# Patient Record
Sex: Male | Born: 1970
Health system: Southern US, Community
[De-identification: ages and names within clinical notes are randomized; demographics above are authoritative.]

## PROBLEM LIST (undated history)

## (undated) DIAGNOSIS — D689 Coagulation defect, unspecified: Secondary | ICD-10-CM

## (undated) DIAGNOSIS — K429 Umbilical hernia without obstruction or gangrene: Secondary | ICD-10-CM

## (undated) DIAGNOSIS — M545 Low back pain, unspecified: Secondary | ICD-10-CM

## (undated) DIAGNOSIS — I513 Intracardiac thrombosis, not elsewhere classified: Secondary | ICD-10-CM

## (undated) HISTORY — PX: ABDOMINAL SURGERY: SHX537

---

## 2001-11-15 ENCOUNTER — Emergency Department (HOSPITAL_COMMUNITY): Admission: EM | Admit: 2001-11-15 | Discharge: 2001-11-16 | Payer: Self-pay | Admitting: Emergency Medicine

## 2002-01-23 ENCOUNTER — Emergency Department (HOSPITAL_COMMUNITY): Admission: EM | Admit: 2002-01-23 | Discharge: 2002-01-24 | Payer: Self-pay | Admitting: Emergency Medicine

## 2006-03-28 HISTORY — PX: ANKLE FRACTURE SURGERY: SHX122

## 2006-03-28 HISTORY — PX: ORIF FEMUR FRACTURE: SHX2119

## 2007-02-23 ENCOUNTER — Inpatient Hospital Stay (HOSPITAL_COMMUNITY): Admission: EM | Admit: 2007-02-23 | Discharge: 2007-03-10 | Payer: Self-pay | Admitting: Emergency Medicine

## 2007-03-01 ENCOUNTER — Ambulatory Visit: Payer: Self-pay | Admitting: Physical Medicine & Rehabilitation

## 2007-08-10 ENCOUNTER — Encounter: Admission: RE | Admit: 2007-08-10 | Discharge: 2007-08-10 | Payer: Self-pay | Admitting: Orthopedic Surgery

## 2007-11-09 ENCOUNTER — Encounter
Admission: RE | Admit: 2007-11-09 | Discharge: 2007-11-09 | Payer: Self-pay | Admitting: Physical Medicine & Rehabilitation
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
## 2011-01-03 LAB — POCT I-STAT 3, ART BLOOD GAS (G3+)
Bicarbonate: 29.3 — ABNORMAL HIGH
Operator id: 280981
TCO2: 31
pCO2 arterial: 48.1 — ABNORMAL HIGH
pH, Arterial: 7.395
pO2, Arterial: 91

## 2011-01-03 LAB — PREPARE FRESH FROZEN PLASMA

## 2011-01-03 LAB — PROTIME-INR
INR: 1.5
INR: 3.3 — ABNORMAL HIGH
Prothrombin Time: 18.5 — ABNORMAL HIGH
Prothrombin Time: 32.6 — ABNORMAL HIGH
Prothrombin Time: 34.6 — ABNORMAL HIGH

## 2011-01-03 LAB — BASIC METABOLIC PANEL
BUN: 7
BUN: 7
CO2: 24
CO2: 29
Calcium: 7.8 — ABNORMAL LOW
Chloride: 102
Chloride: 104
Creatinine, Ser: 0.75
GFR calc non Af Amer: >60
Glucose, Bld: 93
Sodium: 136

## 2011-01-03 LAB — CBC
HCT: 30.8 — ABNORMAL LOW
Hemoglobin: 10.3 — ABNORMAL LOW
Hemoglobin: 11.2 — ABNORMAL LOW
MCHC: 34.1
MCV: 89.1
MCV: 89.5
RDW: 13.6
RDW: 14
RDW: 14.6
WBC: 8.5
WBC: 9.7

## 2011-01-03 LAB — TYPE AND SCREEN
ABO/RH(D): B POS
Antibody Screen: NEGATIVE

## 2011-01-04 LAB — CULTURE, BAL-QUANTITATIVE W GRAM STAIN: Colony Count: 50000

## 2011-01-04 LAB — POCT I-STAT 3, ART BLOOD GAS (G3+)
Bicarbonate: 23.5
O2 Saturation: 99
TCO2: 25
pCO2 arterial: 36.1
pH, Arterial: 7.422
pO2, Arterial: 148 — ABNORMAL HIGH

## 2011-01-04 LAB — POCT I-STAT 7, (LYTES, BLD GAS, ICA,H+H)
Calcium, Ion: 0.89 — ABNORMAL LOW
Calcium, Ion: 0.99 — ABNORMAL LOW
HCT: 40
HCT: 45
Hemoglobin: 15.3
Operator id: 151301
Operator id: 151301
Patient temperature: 35
Patient temperature: 35.4
pCO2 arterial: 32.7 — ABNORMAL LOW
pCO2 arterial: 48.8 — ABNORMAL HIGH
pO2, Arterial: 100
pO2, Arterial: 184 — ABNORMAL HIGH

## 2011-01-04 LAB — CBC
HCT: 35.4 — ABNORMAL LOW
HCT: 35.5 — ABNORMAL LOW
Hemoglobin: 11.9 — ABNORMAL LOW
Hemoglobin: 12.1 — ABNORMAL LOW
Hemoglobin: 12.1 — ABNORMAL LOW
Hemoglobin: 12.3 — ABNORMAL LOW
Hemoglobin: 17.5 — ABNORMAL HIGH
MCHC: 33.3
MCHC: 33.8
MCHC: 34.2
MCV: 88.4
MCV: 90.1
Platelets: 183
RBC: 3.95 — ABNORMAL LOW
RBC: 4.02 — ABNORMAL LOW
RBC: 4.04 — ABNORMAL LOW
RBC: 5.83 — ABNORMAL HIGH
RDW: 13.8
RDW: 14
RDW: 14.3
WBC: 12.2 — ABNORMAL HIGH

## 2011-01-04 LAB — POCT I-STAT 4, (NA,K, GLUC, HGB,HCT)
Glucose, Bld: 95
Hemoglobin: 13.6
Hemoglobin: 17

## 2011-01-04 LAB — I-STAT EC8
Acid-base deficit: 5 — ABNORMAL HIGH
BUN: 5 — ABNORMAL LOW
Bicarbonate: 19.9 — ABNORMAL LOW
Bicarbonate: 20.2
Chloride: 107
Glucose, Bld: 83
HCT: 40
Hemoglobin: 12.2 — ABNORMAL LOW
Hemoglobin: 13.6
Operator id: 198871
Sodium: 141
Sodium: 142
TCO2: 21
pCO2 arterial: 36.2
pH, Arterial: 7.349 — ABNORMAL LOW

## 2011-01-04 LAB — BASIC METABOLIC PANEL
BUN: 6
CO2: 27
CO2: 28
Calcium: 6.9 — ABNORMAL LOW
Calcium: 7.9 — ABNORMAL LOW
GFR calc Af Amer: >60
GFR calc non Af Amer: >60
GFR calc non Af Amer: >60
Glucose, Bld: 113 — ABNORMAL HIGH
Glucose, Bld: 98
Potassium: 4.5
Sodium: 133 — ABNORMAL LOW

## 2011-01-04 LAB — I-STAT 8, (EC8 V) (CONVERTED LAB)
Acid-base deficit: 5 — ABNORMAL HIGH
BUN: 6
Chloride: 106
pCO2, Ven: 39.5 — ABNORMAL LOW
pH, Ven: 7.332 — ABNORMAL HIGH

## 2011-01-04 LAB — PROTIME-INR
INR: 1.1
Prothrombin Time: 13.3

## 2011-01-04 LAB — POCT I-STAT CREATININE: Operator id: 282201

## 2011-01-04 LAB — ETHANOL: Alcohol, Ethyl (B): 378 — ABNORMAL HIGH

## 2019-07-22 DIAGNOSIS — R112 Nausea with vomiting, unspecified: Secondary | ICD-10-CM | POA: Diagnosis not present

## 2019-07-22 DIAGNOSIS — K429 Umbilical hernia without obstruction or gangrene: Secondary | ICD-10-CM | POA: Diagnosis not present

## 2019-07-22 DIAGNOSIS — R1032 Left lower quadrant pain: Secondary | ICD-10-CM | POA: Diagnosis not present

## 2019-07-22 DIAGNOSIS — M549 Dorsalgia, unspecified: Secondary | ICD-10-CM | POA: Diagnosis not present

## 2019-08-21 DIAGNOSIS — K429 Umbilical hernia without obstruction or gangrene: Secondary | ICD-10-CM | POA: Diagnosis not present

## 2019-08-21 DIAGNOSIS — Z1211 Encounter for screening for malignant neoplasm of colon: Secondary | ICD-10-CM | POA: Diagnosis not present

## 2019-09-06 DIAGNOSIS — Z03818 Encounter for observation for suspected exposure to other biological agents ruled out: Secondary | ICD-10-CM | POA: Diagnosis not present

## 2019-09-09 DIAGNOSIS — K432 Incisional hernia without obstruction or gangrene: Secondary | ICD-10-CM | POA: Diagnosis not present

## 2019-09-11 DIAGNOSIS — K432 Incisional hernia without obstruction or gangrene: Secondary | ICD-10-CM | POA: Diagnosis not present

## 2019-09-13 DIAGNOSIS — Z1211 Encounter for screening for malignant neoplasm of colon: Secondary | ICD-10-CM | POA: Diagnosis not present

## 2019-09-13 DIAGNOSIS — D128 Benign neoplasm of rectum: Secondary | ICD-10-CM | POA: Diagnosis not present

## 2019-09-13 DIAGNOSIS — D122 Benign neoplasm of ascending colon: Secondary | ICD-10-CM | POA: Diagnosis not present

## 2019-09-13 DIAGNOSIS — D123 Benign neoplasm of transverse colon: Secondary | ICD-10-CM | POA: Diagnosis not present

## 2019-09-13 DIAGNOSIS — K648 Other hemorrhoids: Secondary | ICD-10-CM | POA: Diagnosis not present

## 2019-09-13 DIAGNOSIS — K621 Rectal polyp: Secondary | ICD-10-CM | POA: Diagnosis not present

## 2019-09-13 DIAGNOSIS — K635 Polyp of colon: Secondary | ICD-10-CM | POA: Diagnosis not present

## 2019-09-19 DIAGNOSIS — K432 Incisional hernia without obstruction or gangrene: Secondary | ICD-10-CM | POA: Diagnosis not present

## 2019-09-19 DIAGNOSIS — K869 Disease of pancreas, unspecified: Secondary | ICD-10-CM | POA: Diagnosis not present

## 2019-10-08 DIAGNOSIS — I82890 Acute embolism and thrombosis of other specified veins: Secondary | ICD-10-CM | POA: Diagnosis not present

## 2019-10-08 DIAGNOSIS — K869 Disease of pancreas, unspecified: Secondary | ICD-10-CM | POA: Diagnosis not present

## 2019-10-08 DIAGNOSIS — I748 Embolism and thrombosis of other arteries: Secondary | ICD-10-CM | POA: Diagnosis not present

## 2019-10-09 DIAGNOSIS — I81 Portal vein thrombosis: Secondary | ICD-10-CM | POA: Diagnosis not present

## 2019-10-09 DIAGNOSIS — I748 Embolism and thrombosis of other arteries: Secondary | ICD-10-CM | POA: Diagnosis not present

## 2019-10-09 DIAGNOSIS — I82890 Acute embolism and thrombosis of other specified veins: Secondary | ICD-10-CM | POA: Diagnosis not present

## 2019-10-09 DIAGNOSIS — F17200 Nicotine dependence, unspecified, uncomplicated: Secondary | ICD-10-CM | POA: Diagnosis not present

## 2019-10-09 DIAGNOSIS — D519 Vitamin B12 deficiency anemia, unspecified: Secondary | ICD-10-CM | POA: Diagnosis not present

## 2019-10-23 DIAGNOSIS — I81 Portal vein thrombosis: Secondary | ICD-10-CM | POA: Diagnosis not present

## 2019-10-23 DIAGNOSIS — R079 Chest pain, unspecified: Secondary | ICD-10-CM | POA: Diagnosis not present

## 2019-10-24 DIAGNOSIS — I748 Embolism and thrombosis of other arteries: Secondary | ICD-10-CM | POA: Diagnosis not present

## 2019-10-24 DIAGNOSIS — I81 Portal vein thrombosis: Secondary | ICD-10-CM | POA: Diagnosis not present

## 2019-10-24 DIAGNOSIS — Z79899 Other long term (current) drug therapy: Secondary | ICD-10-CM | POA: Diagnosis not present

## 2019-10-24 DIAGNOSIS — R978 Other abnormal tumor markers: Secondary | ICD-10-CM | POA: Diagnosis not present

## 2019-11-01 DIAGNOSIS — D519 Vitamin B12 deficiency anemia, unspecified: Secondary | ICD-10-CM | POA: Diagnosis not present

## 2019-11-21 DIAGNOSIS — I81 Portal vein thrombosis: Secondary | ICD-10-CM | POA: Diagnosis not present

## 2019-11-21 DIAGNOSIS — Z87891 Personal history of nicotine dependence: Secondary | ICD-10-CM | POA: Diagnosis not present

## 2019-11-21 DIAGNOSIS — F17210 Nicotine dependence, cigarettes, uncomplicated: Secondary | ICD-10-CM | POA: Diagnosis not present

## 2019-11-21 DIAGNOSIS — D519 Vitamin B12 deficiency anemia, unspecified: Secondary | ICD-10-CM | POA: Diagnosis not present

## 2019-12-28 ENCOUNTER — Emergency Department (HOSPITAL_COMMUNITY): Payer: BC Managed Care – PPO | Admitting: Anesthesiology

## 2019-12-28 ENCOUNTER — Encounter (HOSPITAL_COMMUNITY)
Admission: EM | Disposition: A | Discharge status: Home / Self Care | Payer: Self-pay | Source: Home / Self Care | Attending: Neurology

## 2019-12-28 ENCOUNTER — Emergency Department (HOSPITAL_COMMUNITY): Payer: BC Managed Care – PPO

## 2019-12-28 ENCOUNTER — Inpatient Hospital Stay (HOSPITAL_COMMUNITY)
Admission: EM | Admit: 2019-12-28 | Discharge: 2020-01-01 | DRG: 023 | Disposition: A | Discharge status: Home / Self Care | Payer: BC Managed Care – PPO | Attending: Neurology | Admitting: Neurology

## 2019-12-28 ENCOUNTER — Encounter (HOSPITAL_COMMUNITY): Payer: Self-pay | Admitting: Radiology

## 2019-12-28 ENCOUNTER — Inpatient Hospital Stay (HOSPITAL_COMMUNITY): Payer: BC Managed Care – PPO

## 2019-12-28 DIAGNOSIS — I10 Essential (primary) hypertension: Secondary | ICD-10-CM | POA: Diagnosis not present

## 2019-12-28 DIAGNOSIS — I6312 Cerebral infarction due to embolism of basilar artery: Secondary | ICD-10-CM | POA: Diagnosis not present

## 2019-12-28 DIAGNOSIS — I6302 Cerebral infarction due to thrombosis of basilar artery: Secondary | ICD-10-CM | POA: Diagnosis not present

## 2019-12-28 DIAGNOSIS — Z23 Encounter for immunization: Secondary | ICD-10-CM | POA: Diagnosis not present

## 2019-12-28 DIAGNOSIS — R29717 NIHSS score 17: Secondary | ICD-10-CM | POA: Diagnosis present

## 2019-12-28 DIAGNOSIS — Z6836 Body mass index (BMI) 36.0-36.9, adult: Secondary | ICD-10-CM | POA: Diagnosis not present

## 2019-12-28 DIAGNOSIS — K432 Incisional hernia without obstruction or gangrene: Secondary | ICD-10-CM | POA: Diagnosis present

## 2019-12-28 DIAGNOSIS — I236 Thrombosis of atrium, auricular appendage, and ventricle as current complications following acute myocardial infarction: Secondary | ICD-10-CM | POA: Diagnosis not present

## 2019-12-28 DIAGNOSIS — F17200 Nicotine dependence, unspecified, uncomplicated: Secondary | ICD-10-CM | POA: Diagnosis not present

## 2019-12-28 DIAGNOSIS — R42 Dizziness and giddiness: Secondary | ICD-10-CM | POA: Diagnosis present

## 2019-12-28 DIAGNOSIS — H5500 Unspecified nystagmus: Secondary | ICD-10-CM | POA: Diagnosis present

## 2019-12-28 DIAGNOSIS — Z833 Family history of diabetes mellitus: Secondary | ICD-10-CM | POA: Diagnosis not present

## 2019-12-28 DIAGNOSIS — I161 Hypertensive emergency: Secondary | ICD-10-CM | POA: Diagnosis not present

## 2019-12-28 DIAGNOSIS — Z7901 Long term (current) use of anticoagulants: Secondary | ICD-10-CM

## 2019-12-28 DIAGNOSIS — D689 Coagulation defect, unspecified: Secondary | ICD-10-CM | POA: Diagnosis present

## 2019-12-28 DIAGNOSIS — G4489 Other headache syndrome: Secondary | ICD-10-CM | POA: Diagnosis not present

## 2019-12-28 DIAGNOSIS — I651 Occlusion and stenosis of basilar artery: Secondary | ICD-10-CM

## 2019-12-28 DIAGNOSIS — J341 Cyst and mucocele of nose and nasal sinus: Secondary | ICD-10-CM | POA: Diagnosis not present

## 2019-12-28 DIAGNOSIS — G459 Transient cerebral ischemic attack, unspecified: Secondary | ICD-10-CM | POA: Diagnosis not present

## 2019-12-28 DIAGNOSIS — Z79899 Other long term (current) drug therapy: Secondary | ICD-10-CM | POA: Diagnosis not present

## 2019-12-28 DIAGNOSIS — Z20822 Contact with and (suspected) exposure to covid-19: Secondary | ICD-10-CM | POA: Diagnosis not present

## 2019-12-28 DIAGNOSIS — E785 Hyperlipidemia, unspecified: Secondary | ICD-10-CM | POA: Diagnosis not present

## 2019-12-28 DIAGNOSIS — I81 Portal vein thrombosis: Secondary | ICD-10-CM | POA: Diagnosis not present

## 2019-12-28 DIAGNOSIS — R471 Dysarthria and anarthria: Secondary | ICD-10-CM

## 2019-12-28 DIAGNOSIS — I748 Embolism and thrombosis of other arteries: Secondary | ICD-10-CM | POA: Diagnosis present

## 2019-12-28 DIAGNOSIS — R531 Weakness: Secondary | ICD-10-CM | POA: Diagnosis not present

## 2019-12-28 DIAGNOSIS — G8191 Hemiplegia, unspecified affecting right dominant side: Secondary | ICD-10-CM | POA: Diagnosis not present

## 2019-12-28 DIAGNOSIS — E7800 Pure hypercholesterolemia, unspecified: Secondary | ICD-10-CM | POA: Diagnosis not present

## 2019-12-28 DIAGNOSIS — I639 Cerebral infarction, unspecified: Secondary | ICD-10-CM | POA: Diagnosis not present

## 2019-12-28 DIAGNOSIS — I219 Acute myocardial infarction, unspecified: Secondary | ICD-10-CM | POA: Diagnosis present

## 2019-12-28 DIAGNOSIS — E669 Obesity, unspecified: Secondary | ICD-10-CM | POA: Diagnosis present

## 2019-12-28 DIAGNOSIS — F17210 Nicotine dependence, cigarettes, uncomplicated: Secondary | ICD-10-CM | POA: Diagnosis present

## 2019-12-28 DIAGNOSIS — H518 Other specified disorders of binocular movement: Secondary | ICD-10-CM | POA: Diagnosis present

## 2019-12-28 DIAGNOSIS — I6322 Cerebral infarction due to unspecified occlusion or stenosis of basilar arteries: Secondary | ICD-10-CM | POA: Diagnosis not present

## 2019-12-28 DIAGNOSIS — K8020 Calculus of gallbladder without cholecystitis without obstruction: Secondary | ICD-10-CM | POA: Diagnosis present

## 2019-12-28 DIAGNOSIS — F1010 Alcohol abuse, uncomplicated: Secondary | ICD-10-CM | POA: Diagnosis present

## 2019-12-28 DIAGNOSIS — I513 Intracardiac thrombosis, not elsewhere classified: Secondary | ICD-10-CM | POA: Diagnosis not present

## 2019-12-28 DIAGNOSIS — Z832 Family history of diseases of the blood and blood-forming organs and certain disorders involving the immune mechanism: Secondary | ICD-10-CM

## 2019-12-28 DIAGNOSIS — R9431 Abnormal electrocardiogram [ECG] [EKG]: Secondary | ICD-10-CM | POA: Diagnosis not present

## 2019-12-28 DIAGNOSIS — R29818 Other symptoms and signs involving the nervous system: Secondary | ICD-10-CM | POA: Diagnosis not present

## 2019-12-28 DIAGNOSIS — R9082 White matter disease, unspecified: Secondary | ICD-10-CM | POA: Diagnosis not present

## 2019-12-28 DIAGNOSIS — I6389 Other cerebral infarction: Secondary | ICD-10-CM | POA: Diagnosis not present

## 2019-12-28 DIAGNOSIS — R202 Paresthesia of skin: Secondary | ICD-10-CM | POA: Diagnosis not present

## 2019-12-28 DIAGNOSIS — R0902 Hypoxemia: Secondary | ICD-10-CM | POA: Diagnosis not present

## 2019-12-28 HISTORY — DX: Umbilical hernia without obstruction or gangrene: K42.9

## 2019-12-28 HISTORY — DX: Coagulation defect, unspecified: D68.9

## 2019-12-28 HISTORY — PX: IR PERCUTANEOUS ART THROMBECTOMY/INFUSION INTRACRANIAL INC DIAG ANGIO: IMG6087

## 2019-12-28 HISTORY — PX: IR INTRA CRAN STENT: IMG2345

## 2019-12-28 HISTORY — DX: Cerebral infarction due to unspecified occlusion or stenosis of basilar artery: I63.22

## 2019-12-28 HISTORY — PX: RADIOLOGY WITH ANESTHESIA: SHX6223

## 2019-12-28 HISTORY — PX: IR CT HEAD LTD: IMG2386

## 2019-12-28 HISTORY — PX: IR ANGIO EXTRACRAN SEL COM CAROTID INNOMINATE UNI L MOD SED: IMG5355

## 2019-12-28 HISTORY — DX: Low back pain, unspecified: M54.50

## 2019-12-28 HISTORY — DX: Intracardiac thrombosis, not elsewhere classified: I51.3

## 2019-12-28 LAB — DIFFERENTIAL
Abs Immature Granulocytes: 0.14 10*3/uL — ABNORMAL HIGH (ref 0.00–0.07)
Basophils Absolute: 0.1 10*3/uL (ref 0.0–0.1)
Basophils Relative: 1 %
Eosinophils Absolute: 0.1 10*3/uL (ref 0.0–0.5)
Eosinophils Relative: 1 %
Immature Granulocytes: 1 %
Lymphocytes Relative: 14 %
Lymphs Abs: 2.2 10*3/uL (ref 0.7–4.0)
Monocytes Absolute: 1.1 10*3/uL — ABNORMAL HIGH (ref 0.1–1.0)
Monocytes Relative: 7 %
Neutro Abs: 12 10*3/uL — ABNORMAL HIGH (ref 1.7–7.7)
Neutrophils Relative %: 76 %

## 2019-12-28 LAB — COMPREHENSIVE METABOLIC PANEL
ALT: 29 U/L (ref 0–44)
AST: 25 U/L (ref 15–41)
Albumin: 4.4 g/dL (ref 3.5–5.0)
Alkaline Phosphatase: 95 U/L (ref 38–126)
Anion gap: 15 (ref 5–15)
BUN: 7 mg/dL (ref 6–20)
CO2: 22 mmol/L (ref 22–32)
Calcium: 9.9 mg/dL (ref 8.9–10.3)
Chloride: 101 mmol/L (ref 98–111)
Creatinine, Ser: 1.33 mg/dL — ABNORMAL HIGH (ref 0.61–1.24)
GFR calc Af Amer: >60 mL/min (ref >60)
GFR calc non Af Amer: >60 mL/min (ref >60)
Glucose, Bld: 145 mg/dL — ABNORMAL HIGH (ref 70–99)
Potassium: 3.9 mmol/L (ref 3.5–5.1)
Sodium: 138 mmol/L (ref 135–145)
Total Bilirubin: 1.6 mg/dL — ABNORMAL HIGH (ref 0.3–1.2)
Total Protein: 7.8 g/dL (ref 6.5–8.1)

## 2019-12-28 LAB — CBC
HCT: 56 % — ABNORMAL HIGH (ref 39.0–52.0)
Hemoglobin: 18.1 g/dL — ABNORMAL HIGH (ref 13.0–17.0)
MCH: 28.1 pg (ref 26.0–34.0)
MCHC: 32.3 g/dL (ref 30.0–36.0)
MCV: 87.1 fL (ref 80.0–100.0)
Platelets: 338 10*3/uL (ref 150–400)
RBC: 6.43 MIL/uL — ABNORMAL HIGH (ref 4.22–5.81)
RDW: 14.6 % (ref 11.5–15.5)
WBC: 15.7 10*3/uL — ABNORMAL HIGH (ref 4.0–10.5)
nRBC: 0 % (ref 0.0–0.2)

## 2019-12-28 LAB — APTT: aPTT: 29 seconds (ref 24–36)

## 2019-12-28 LAB — I-STAT CHEM 8, ED
BUN: 8 mg/dL (ref 6–20)
Calcium, Ion: 1.08 mmol/L — ABNORMAL LOW (ref 1.15–1.40)
Chloride: 101 mmol/L (ref 98–111)
Creatinine, Ser: 1.2 mg/dL (ref 0.61–1.24)
Glucose, Bld: 143 mg/dL — ABNORMAL HIGH (ref 70–99)
HCT: 58 % — ABNORMAL HIGH (ref 39.0–52.0)
Hemoglobin: 19.7 g/dL — ABNORMAL HIGH (ref 13.0–17.0)
Potassium: 3.9 mmol/L (ref 3.5–5.1)
Sodium: 139 mmol/L (ref 135–145)
TCO2: 24 mmol/L (ref 22–32)

## 2019-12-28 LAB — PROTIME-INR
INR: 1 (ref 0.8–1.2)
Prothrombin Time: 13 seconds (ref 11.4–15.2)

## 2019-12-28 LAB — CBG MONITORING, ED: Glucose-Capillary: 138 mg/dL — ABNORMAL HIGH (ref 70–99)

## 2019-12-28 LAB — RESPIRATORY PANEL BY RT PCR (FLU A&B, COVID)
Influenza A by PCR: NEGATIVE
Influenza B by PCR: NEGATIVE
SARS Coronavirus 2 by RT PCR: NEGATIVE

## 2019-12-28 LAB — MRSA PCR SCREENING: MRSA by PCR: NEGATIVE

## 2019-12-28 SURGERY — IR WITH ANESTHESIA
Anesthesia: General

## 2019-12-28 MED ORDER — ACETAMINOPHEN 650 MG RE SUPP: 650.0000 mg | RECTAL | Status: DC | PRN

## 2019-12-28 MED ORDER — ASPIRIN 81 MG PO CHEW
CHEWABLE_TABLET | ORAL | Status: AC
  Filled 2019-12-28: qty 1

## 2019-12-28 MED ORDER — SUCCINYLCHOLINE CHLORIDE 200 MG/10ML IV SOSY
PREFILLED_SYRINGE | INTRAVENOUS | Status: DC | PRN
  Administered 2019-12-28: 140 mg via INTRAVENOUS

## 2019-12-28 MED ORDER — CANGRELOR TETRASODIUM 50 MG IV SOLR
INTRAVENOUS | Status: AC
  Filled 2019-12-28: qty 50

## 2019-12-28 MED ORDER — SUGAMMADEX SODIUM 200 MG/2ML IV SOLN
INTRAVENOUS | Status: DC | PRN
  Administered 2019-12-28: 200 mg via INTRAVENOUS

## 2019-12-28 MED ORDER — DEXAMETHASONE SODIUM PHOSPHATE 10 MG/ML IJ SOLN
INTRAMUSCULAR | Status: DC | PRN
  Administered 2019-12-28: 10 mg via INTRAVENOUS

## 2019-12-28 MED ORDER — LACTATED RINGERS IV SOLN: INTRAVENOUS | Status: DC | PRN

## 2019-12-28 MED ORDER — IOHEXOL 350 MG/ML SOLN
100.0000 mL | Freq: Once | INTRAVENOUS | Status: AC | PRN
  Administered 2019-12-28: 100 mL via INTRAVENOUS

## 2019-12-28 MED ORDER — PHENYLEPHRINE HCL-NACL 10-0.9 MG/250ML-% IV SOLN
INTRAVENOUS | Status: DC | PRN
  Administered 2019-12-28: 25 ug/min via INTRAVENOUS

## 2019-12-28 MED ORDER — LABETALOL HCL 5 MG/ML IV SOLN
INTRAVENOUS | Status: DC | PRN
  Administered 2019-12-28 (×2): 10 mg via INTRAVENOUS

## 2019-12-28 MED ORDER — METOCLOPRAMIDE HCL 5 MG/ML IJ SOLN
10.0000 mg | Freq: Once | INTRAMUSCULAR | Status: AC
  Administered 2019-12-28: 10 mg via INTRAVENOUS
  Filled 2019-12-28: qty 2

## 2019-12-28 MED ORDER — ONDANSETRON HCL 4 MG/2ML IJ SOLN
INTRAMUSCULAR | Status: DC | PRN
  Administered 2019-12-28: 4 mg via INTRAVENOUS

## 2019-12-28 MED ORDER — CLEVIDIPINE BUTYRATE 0.5 MG/ML IV EMUL
INTRAVENOUS | Status: DC | PRN
  Administered 2019-12-28: 4 mg/h via INTRAVENOUS

## 2019-12-28 MED ORDER — IOHEXOL 300 MG/ML  SOLN
150.0000 mL | Freq: Once | INTRAMUSCULAR | Status: AC | PRN
  Administered 2019-12-28: 70 mL via INTRA_ARTERIAL

## 2019-12-28 MED ORDER — CANGRELOR BOLUS VIA INFUSION
INTRAVENOUS | Status: AC | PRN
  Administered 2019-12-28: 1803 ug via INTRAVENOUS

## 2019-12-28 MED ORDER — TICAGRELOR 90 MG PO TABS
90.0000 mg | ORAL_TABLET | Freq: Two times a day (BID) | ORAL | Status: DC
  Administered 2019-12-29 – 2020-01-01 (×7): 90 mg via ORAL
  Filled 2019-12-28 (×7): qty 1

## 2019-12-28 MED ORDER — CEFAZOLIN SODIUM-DEXTROSE 2-3 GM-%(50ML) IV SOLR
INTRAVENOUS | Status: DC | PRN
  Administered 2019-12-28: 2 g via INTRAVENOUS

## 2019-12-28 MED ORDER — SODIUM CHLORIDE 0.9% FLUSH: 3.0000 mL | Freq: Once | INTRAVENOUS | Status: DC

## 2019-12-28 MED ORDER — NITROGLYCERIN 1 MG/10 ML FOR IR/CATH LAB
INTRA_ARTERIAL | Status: AC
  Filled 2019-12-28: qty 10

## 2019-12-28 MED ORDER — ASPIRIN 81 MG PO CHEW: 81.0000 mg | CHEWABLE_TABLET | Freq: Every day | ORAL | Status: DC

## 2019-12-28 MED ORDER — TICAGRELOR 90 MG PO TABS: 90.0000 mg | ORAL_TABLET | Freq: Two times a day (BID) | ORAL | Status: DC

## 2019-12-28 MED ORDER — ROCURONIUM BROMIDE 10 MG/ML (PF) SYRINGE
PREFILLED_SYRINGE | INTRAVENOUS | Status: DC | PRN
  Administered 2019-12-28: 20 mg via INTRAVENOUS
  Administered 2019-12-28: 60 mg via INTRAVENOUS

## 2019-12-28 MED ORDER — CHLORHEXIDINE GLUCONATE CLOTH 2 % EX PADS
6.0000 | MEDICATED_PAD | Freq: Every day | CUTANEOUS | Status: DC
  Administered 2019-12-28 – 2020-01-01 (×4): 6 via TOPICAL

## 2019-12-28 MED ORDER — HEPARIN SODIUM (PORCINE) 5000 UNIT/ML IJ SOLN
5000.0000 [IU] | Freq: Three times a day (TID) | INTRAMUSCULAR | Status: DC
  Administered 2019-12-29: 5000 [IU] via SUBCUTANEOUS
  Filled 2019-12-28: qty 1

## 2019-12-28 MED ORDER — FENTANYL CITRATE (PF) 100 MCG/2ML IJ SOLN
INTRAMUSCULAR | Status: DC | PRN
  Administered 2019-12-28 (×2): 50 ug via INTRAVENOUS

## 2019-12-28 MED ORDER — LIDOCAINE 2% (20 MG/ML) 5 ML SYRINGE
INTRAMUSCULAR | Status: DC | PRN
  Administered 2019-12-28: 40 mg via INTRAVENOUS

## 2019-12-28 MED ORDER — ASPIRIN 81 MG PO CHEW
81.0000 mg | CHEWABLE_TABLET | Freq: Every day | ORAL | Status: DC
  Administered 2019-12-29 – 2020-01-01 (×4): 81 mg via ORAL
  Filled 2019-12-28 (×4): qty 1

## 2019-12-28 MED ORDER — TICAGRELOR 60 MG PO TABS
ORAL_TABLET | ORAL | Status: AC | PRN
  Administered 2019-12-28: 180 mg via NASOGASTRIC

## 2019-12-28 MED ORDER — TICAGRELOR 90 MG PO TABS
ORAL_TABLET | ORAL | Status: AC
  Filled 2019-12-28: qty 2

## 2019-12-28 MED ORDER — SODIUM CHLORIDE 0.9 % IV SOLN: INTRAVENOUS | Status: DC

## 2019-12-28 MED ORDER — FENTANYL CITRATE (PF) 100 MCG/2ML IJ SOLN: 50.0000 ug | INTRAMUSCULAR | Status: DC | PRN

## 2019-12-28 MED ORDER — ASPIRIN 325 MG PO TABS
ORAL_TABLET | ORAL | Status: AC | PRN
  Administered 2019-12-28: 81 mg via NASOGASTRIC

## 2019-12-28 MED ORDER — CEFAZOLIN SODIUM-DEXTROSE 2-4 GM/100ML-% IV SOLN
INTRAVENOUS | Status: AC
  Filled 2019-12-28: qty 100

## 2019-12-28 MED ORDER — ACETAMINOPHEN 160 MG/5ML PO SOLN: 650.0000 mg | ORAL | Status: DC | PRN

## 2019-12-28 MED ORDER — ONDANSETRON HCL 4 MG/2ML IJ SOLN
4.0000 mg | Freq: Four times a day (QID) | INTRAMUSCULAR | Status: DC | PRN
  Administered 2019-12-28: 4 mg via INTRAVENOUS
  Filled 2019-12-28: qty 2

## 2019-12-28 MED ORDER — FENTANYL NICU IV SYRINGE 50 MCG/ML
50.0000 ug | INJECTION | INTRAMUSCULAR | Status: DC | PRN
Start: 2019-12-28 — End: 2019-12-28

## 2019-12-28 MED ORDER — PROPOFOL 10 MG/ML IV BOLUS
INTRAVENOUS | Status: DC | PRN
  Administered 2019-12-28: 160 mg via INTRAVENOUS

## 2019-12-28 MED ORDER — SODIUM CHLORIDE 0.9 % IV SOLN
INTRAVENOUS | Status: AC | PRN
  Administered 2019-12-28: 2 ug/kg/min via INTRAVENOUS

## 2019-12-28 MED ORDER — FENTANYL CITRATE (PF) 100 MCG/2ML IJ SOLN
INTRAMUSCULAR | Status: AC
Start: 2019-12-28 — End: 2019-12-29
  Filled 2019-12-28: qty 2

## 2019-12-28 MED ORDER — CLEVIDIPINE BUTYRATE 0.5 MG/ML IV EMUL
0.0000 mg/h | INTRAVENOUS | Status: DC
  Administered 2019-12-28: 16 mg/h via INTRAVENOUS
  Administered 2019-12-28: 21 mg/h via INTRAVENOUS
  Administered 2019-12-28: 2 mg/h via INTRAVENOUS
  Administered 2019-12-28 – 2019-12-29 (×4): 21 mg/h via INTRAVENOUS
  Administered 2019-12-29: 20 mg/h via INTRAVENOUS
  Administered 2019-12-29: 16 mg/h via INTRAVENOUS
  Filled 2019-12-28 (×7): qty 50
  Filled 2019-12-28: qty 100
  Filled 2019-12-28 (×2): qty 50

## 2019-12-28 MED ORDER — ACETAMINOPHEN 325 MG PO TABS
650.0000 mg | ORAL_TABLET | ORAL | Status: DC | PRN
  Administered 2019-12-31: 650 mg via ORAL
  Filled 2019-12-28: qty 2

## 2019-12-28 NOTE — ED Provider Notes (Signed)
[facility] EMERGENCY DEPARTMENT Provider Note   CSN: [id number] Arrival date & time: 12/28/19  1135     History No chief complaint on file.   [name] is a 49 y.o. male.  HPI     Patient presents as a code stroke. Patient arrives via EMS and history is obtained by those individuals as well as the patient himself.  Patient was in his usual state of health until yesterday.  In the evening, at least 12 hours ago, patient began feeling dizzy.  This morning he awoke with speech difficulty, right-sided weakness and facial asymmetry.  No clear precipitant.  Patient did recently stop taking Eliquis which he is taking for DVT. No pain. No report of hemodynamic instability by EMS providers. Patient has no history of prior stroke, does have other medical problems.  Level 5 caveat secondary to acuity of condition.       Patient Active Problem List   Diagnosis Date Noted  . Stroke (HCC) 12/28/2019  . Stroke (cerebrum) (HCC) 12/28/2019    History somewhat limited secondary to patient's acuity of condition, difficulty speaking, and subsequent onset of vomiting, listlessness. According to family members patient has a history of DVT, is supposed to be taking anticoagulant, but has not been doing so in the past days, secondary to reportedly feeling badly.  Social History   Tobacco Use  . Smoking status: Not on file  Substance Use Topics  . Alcohol use: Not on file  . Drug use: Not on file    Home Medications Prior to Admission medications   Not on File    Allergies    Patient has no allergy information on record.  Review of Systems   Review of Systems  Constitutional:       Per HPI, otherwise negative  HENT:       Per HPI, otherwise negative  Eyes: Negative for visual disturbance.  Respiratory:       Per HPI, otherwise negative  Cardiovascular:       Per HPI, otherwise negative  Gastrointestinal: Negative for vomiting.  Endocrine:        Negative aside from HPI  Genitourinary:       Neg aside from HPI   Musculoskeletal:       Per HPI, otherwise negative  Skin: Negative.   Allergic/Immunologic: Negative for immunocompromised state.  Neurological: Positive for facial asymmetry, weakness and numbness. Negative for syncope.  Hematological:       Per HPI  Some limited secondary to acuity of condition.  Physical Exam Updated Vital Signs BP (!) 164/101   Pulse 86   Temp 98.1 F (36.7 C) (Oral)   Resp 19   Wt 120.2 kg   SpO2 91%   Physical Exam Vitals and nursing note reviewed.  Constitutional:      General: He is not in acute distress.    Appearance: He is well-developed. He is ill-appearing and diaphoretic.  HENT:     Head: Normocephalic and atraumatic.  Eyes:     Conjunctiva/sclera: Conjunctivae normal.  Cardiovascular:     Rate and Rhythm: Normal rate and regular rhythm.  Pulmonary:     Effort: Pulmonary effort is normal. No respiratory distress.     Breath sounds: No stridor.  Abdominal:     General: There is no distension.  Skin:    General: Skin is warm.  Neurological:     Mental Status: He is alert and oriented to person, place, and time.  Cranial Nerves: Dysarthria and facial asymmetry present.     Comments: Right-sided strength deficiency, 3/5 in upper and lower extremities.      ED Results / Procedures / Treatments   Labs (all labs ordered are listed, but only abnormal results are displayed) Labs Reviewed  CBC - Abnormal; Notable for the following components:      Result Value   WBC 15.7 (*)    RBC 6.43 (*)    Hemoglobin 18.1 (*)    HCT 56.0 (*)    All other components within normal limits  DIFFERENTIAL - Abnormal; Notable for the following components:   Neutro Abs 12.0 (*)    Monocytes Absolute 1.1 (*)    Abs Immature Granulocytes 0.14 (*)    All other components within normal limits  COMPREHENSIVE METABOLIC PANEL - Abnormal; Notable for the following components:   Glucose, Bld  145 (*)    Creatinine, Ser 1.33 (*)    Total Bilirubin 1.6 (*)    All other components within normal limits  I-STAT CHEM 8, ED - Abnormal; Notable for the following components:   Glucose, Bld 143 (*)    Calcium, Ion 1.08 (*)    Hemoglobin 19.7 (*)    HCT 58.0 (*)    All other components within normal limits  CBG MONITORING, ED - Abnormal; Notable for the following components:   Glucose-Capillary 138 (*)    All other components within normal limits  RESPIRATORY PANEL BY RT PCR (FLU A&B, COVID)  PROTIME-INR  APTT  HIV ANTIBODY (ROUTINE TESTING W REFLEX)    EKG EKG Interpretation  Date/Time:  Saturday December 28 2019 12:04:58 EDT Ventricular Rate:  71 PR Interval:    QRS Duration: 86 QT Interval:  408 QTC Calculation: 444 R Axis:   65 Text Interpretation: Sinus rhythm Inferior infarct, age indeterminate Anterolateral infarct, age indeterminate T wave abnormality Abnormal ECG Confirmed by Gerhard Munch [phone]) on 12/28/2019 1:16:16 PM   Radiology CT CEREBRAL PERFUSION W CONTRAST  Addendum Date: 12/28/2019   ADDENDUM REPORT: 12/28/2019 12:29 ADDENDUM: CORRECTION. In the findings it mentions infarct in the posterior circulation in the perfusion section. This was a typo. There is no core infarct on perfusion, but there is a large area of penumbra. This was clarified with Dr. Thomasena Edis at 12:28 PM via telephone. Electronically Signed   By: Feliberto Harts MD   On: 12/28/2019 12:29   Result Date: 12/28/2019 CLINICAL DATA:  Right-sided weakness, blurry vision, vomiting, headaches. EXAM: CT ANGIOGRAPHY HEAD AND NECK CT PERFUSION BRAIN TECHNIQUE: Multidetector CT imaging of the head and neck was performed using the standard protocol during bolus administration of intravenous contrast. Multiplanar CT image reconstructions and MIPs were obtained to evaluate the vascular anatomy. Carotid stenosis measurements (when applicable) are obtained utilizing NASCET criteria, using the distal internal  carotid diameter as the denominator. Multiphase CT imaging of the brain was performed following IV bolus contrast injection. Subsequent parametric perfusion maps were calculated using RAPID software. CONTRAST:  OMNIPAQUE IOHEXOL 350 MG/ML SOLN COMPARISON:  None. FINDINGS: CTA NECK FINDINGS Aortic arch: Imaged portion shows no evidence of aneurysm or dissection. No significant stenosis of the major arch vessel origins. Right carotid system: Mild narrowing of the proximal right ICA, likely related to atherosclerosis. No evidence of greater than 50% narrowing or occlusion. Left carotid system: No evidence of greater than 50% stenosis or occlusion. Vertebral arteries: Left dominant. No evidence of greater than 50% stenosis or occlusion. Possible tiny filling defect within the left V4  vertebral artery at the level of the PICA origin (series 7, image 146). The right vertebral artery is diminutive throughout its course. Skeleton: Mild multilevel degenerative changes of the cervical spine. Other neck: No mass or adenopathy. Unremarkable appearance of the thyroid gland. Upper chest: Paraseptal and centrilobular emphysema. No consolidation. Review of the MIP images confirms the above findings CTA HEAD FINDINGS Anterior circulation: No evidence of greater than 50% stenosis or occlusion. Bilateral M1 and proximal M2 MCA branches appear widely patent. Diminutive right A1 ACA, likely congenital variation given large left A1 ACA. Bilateral A2 ACA is are well opacified. No evidence of aneurysm. Posterior circulation: There is focal filling defect within the proximal to mid severe narrowing of the artery. Basilar artery, compatible with intraluminal thrombus which results in severe narrowing. Thrombus does not appear to extend to the superior cerebellar or posterior cerebral origins. No evidence of greater than 50% narrowing of the posterior cerebral arteries. No evidence of aneurysm. Venous sinuses: Limited visualization  given contrast bolus timing. Review of the MIP images confirms the above findings CT Brain Perfusion Findings: CBF (<30%) Volume: 0 mLmL Perfusion (Tmax>6.0s) volume: Mismatch Volume: Infarction Location:Posterior circulation, including cerebellum and occipital lobes. IMPRESSION: 1. 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 Possible small amount of thrombus in the left V4 vertebral artery. 2. Perfusion imaging demonstrates a large area of penumbra involving the posterior circulation without core infarct. Code stroke imaging results were communicated on 12/28/2019 at 12:01 p.m. to provider Dr. Thomasena Edis via telephone, who verbally acknowledged these results. Electronically Signed: By: Feliberto Harts MD On: 12/28/2019 12:17   CT HEAD CODE STROKE WO CONTRAST  Result Date: 12/28/2019 CLINICAL DATA:  Code stroke. Dizziness, headache, right-sided weakness. EXAM: CT HEAD WITHOUT CONTRAST TECHNIQUE: Contiguous axial images were obtained from the base of the skull through the vertex without intravenous contrast. COMPARISON:  None. FINDINGS: Brain: No acute hemorrhage. Query linear area of hypoattenuation in the right cerebellum (series 3, image 7), possibly acute or subacute infarct. No hydrocephalus. No mass lesion. 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 Vascular: Mild apparent hyperdensity of the basilar artery, which may relate to streak artifact, but warrants follow-up on forthcoming CTA. Calcific atherosclerosis. Skull: No acute fracture. Sinuses/Orbits: Right maxillary sinus retention cyst. Remote right medial orbital wall fracture. Other: No mastoid effusions. ASPECTS [facility] Stroke Program Early CT Score) Total score (0-10 with 10 being normal): 10 IMPRESSION: 1. Possible acute/subacute right cerebellar infarct. MRI could further evaluate if clinically indicated. 2. Mild apparent  hyperdensity of the basilar artery, which may relate to streak artifact, but warrants follow-up on forthcoming CTA. 3. Otherwise, no evidence of acute large vascular territory infarct or acute hemorrhage. 4. ASPECTS is 10 Code stroke imaging results were communicated on 12/28/2019 at 11:55 am to provider Dr. Thomasena Edis via telephone, who verbally acknowledged these results. Electronically Signed   By: Feliberto Harts MD   On: 12/28/2019 11:59   CT ANGIO HEAD CODE STROKE  Addendum Date: 12/28/2019   ADDENDUM REPORT: 12/28/2019 12:29 ADDENDUM: CORRECTION. In the findings it mentions infarct in the posterior circulation in the perfusion section. This was a typo. There is no core infarct on perfusion, but there is a large area of penumbra. This was clarified with Dr. Thomasena Edis at 12:28 PM via telephone. Electronically Signed   By: Feliberto Harts MD   On: 12/28/2019 12:29   Result Date: 12/28/2019 CLINICAL DATA:  Right-sided weakness, blurry vision, vomiting, headaches. EXAM: CT ANGIOGRAPHY HEAD AND NECK CT PERFUSION BRAIN TECHNIQUE: Multidetector CT imaging of the head and neck was performed using the standard protocol during bolus administration of intravenous contrast. Multiplanar CT image reconstructions and MIPs were obtained to evaluate the vascular anatomy. Carotid stenosis measurements (when applicable) are obtained utilizing NASCET criteria, using the distal internal carotid diameter as the denominator. Multiphase CT imaging of the brain was performed following IV bolus contrast injection. Subsequent parametric perfusion maps were calculated using RAPID software. CONTRAST:  OMNIPAQUE IOHEXOL 350 MG/ML SOLN COMPARISON:  None. FINDINGS: CTA NECK FINDINGS Aortic arch: Imaged portion shows no evidence of aneurysm or dissection. No significant stenosis of the major arch vessel origins. Right carotid system: Mild narrowing of the proximal right ICA, likely related to atherosclerosis. No evidence of greater  than 50% narrowing or occlusion. Left carotid system: No evidence of greater than 50% stenosis or occlusion. Vertebral arteries: Left dominant. No evidence of greater than 50% stenosis or occlusion. Possible tiny filling defect within the left V4 vertebral artery at the level of the PICA origin (series 7, image 146). The right vertebral artery is diminutive throughout its course. Skeleton: Mild multilevel degenerative changes of the cervical spine. Other neck: No mass or adenopathy. Unremarkable appearance of the thyroid gland. Upper chest: Paraseptal and centrilobular emphysema. No consolidation. Review of the MIP images confirms the above findings CTA HEAD FINDINGS Anterior circulation: No evidence of greater than 50% stenosis or occlusion. Bilateral M1 and proximal M2 MCA branches appear widely patent. Diminutive right A1 ACA, likely congenital variation given large left A1 ACA. Bilateral A2 ACA is are well opacified. No evidence of aneurysm. Posterior circulation: There is focal filling defect within the proximal to mid severe narrowing of the artery. Basilar artery, compatible with intraluminal thrombus which results in severe narrowing. Thrombus does not appear to extend to the superior cerebellar or posterior cerebral origins. No evidence of greater than 50% narrowing of the posterior cerebral arteries. No evidence of aneurysm. Venous sinuses: Limited visualization given contrast bolus timing. Review of the MIP images confirms the above findings CT Brain Perfusion Findings: CBF (<30%) Volume: 0 mLmL Perfusion (Tmax>6.0s) volume: Mismatch Volume: Infarction Location:Posterior circulation, including cerebellum and occipital lobes. IMPRESSION: 1. 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 Possible small amount of thrombus in the left V4 vertebral artery. 2. Perfusion imaging demonstrates a large area of penumbra involving  the posterior circulation without core infarct. Code stroke imaging results were communicated on 12/28/2019 at 12:01 p.m. to provider Dr. Thomasena Edis via telephone, who verbally acknowledged these results. Electronically Signed: By: Feliberto Harts MD On: 12/28/2019 12:17   CT ANGIO NECK CODE STROKE  Result Date: 12/28/2019 CLINICAL DATA:  Right-sided weakness, blurred vision, vomiting, headaches. EXAM: CT ANGIOGRAPHY HEAD AND NECK TECHNIQUE: Multidetector CT imaging of the head and neck was performed using the standard protocol during bolus administration of intravenous contrast. Multiplanar CT image reconstructions and MIPs were obtained to evaluate the vascular anatomy. Carotid stenosis measurements (when applicable) are obtained utilizing NASCET criteria, using the distal internal carotid diameter as the denominator. CONTRAST:  OMNIPAQUE IOHEXOL 350 MG/ML SOLN COMPARISON:  None. FINDINGS: CTA NECK FINDINGS Aortic arch: Imaged portion shows no evidence of aneurysm or dissection. No significant stenosis of the major arch vessel origins. Right carotid system: Mild narrowing of the proximal right ICA, likely related to atherosclerosis. No evidence of greater than 50%  narrowing or occlusion. Left carotid system: No evidence of greater than 50% stenosis or occlusion. Vertebral arteries: Left dominant. No evidence of greater than 50% stenosis or occlusion. Possible tiny filling defect within the left V4 vertebral artery at the level of the PICA origin (series 7, image 146). The right vertebral artery is diminutive throughout its course. Skeleton: Mild multilevel degenerative changes of the cervical spine. Other neck: No mass or adenopathy. Unremarkable appearance of the thyroid gland. Upper chest: Paraseptal and centrilobular emphysema. No consolidation. Review of the MIP images confirms the above findings CTA HEAD FINDINGS Anterior circulation: No evidence of greater than 50% stenosis or occlusion. Bilateral M1  and proximal M2 MCA branches appear widely patent. Diminutive right A1 ACA, likely congenital variation given large left A1 ACA. Bilateral A2 ACA is are well opacified. No evidence of aneurysm. Posterior circulation: There is focal filling defect within the proximal to mid severe narrowing of the artery. Basilar artery, compatible with intraluminal thrombus which results in severe narrowing. Thrombus does not appear to extend to the superior cerebellar or posterior cerebral origins. No evidence of greater than 50% narrowing of the posterior cerebral arteries. No evidence of aneurysm. Venous sinuses: Limited visualization given contrast bolus timing. Review of the MIP images confirms the above findings CT Brain Perfusion Findings: CBF (<30%) Volume: 0 mLmL Perfusion (Tmax>6.0s) volume: Mismatch Volume: Penumbra: Posterior circulation, including cerebellum and occipital lobes. IMPRESSION: 1. 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 Possible small amount of thrombus in the left V4 vertebral artery. 2. Perfusion imaging demonstrates a large area of penumbra involving the posterior circulation without core infarct. Code stroke imaging results were communicated on 12/28/2019 at 12: 01 p.m. to provider Dr. Thomasena Edis via telephone, who verbally acknowledged these results. Electronically Signed By: Feliberto Harts MD On: 12/28/2019 12:17 Electronically Signed   By: Feliberto Harts MD   On: 12/28/2019 12:27    Procedures Procedures (including critical care time)  Medications Ordered in ED Medications  sodium chloride flush (NS) 0.9 % injection 3 mL (has no administration in time range)  fentaNYL (SUBLIMAZE) 100 MCG/2ML injection (has no administration in time range)  ceFAZolin (ANCEF) 2-4 GM/100ML-% IVPB (has no administration in time range)  nitroGLYCERIN 100 mcg/mL intra-arterial injection (has no administration in time  range)  aspirin 81 MG chewable tablet (has no administration in time range)  ticagrelor (BRILINTA) 90 MG tablet (has no administration in time range)  ticagrelor (BRILINTA) tablet (180 mg Per NG tube Given 12/28/19 1432)  aspirin tablet (81 mg Per NG tube Given 12/28/19 1432)  heparin injection 5,000 Units (has no administration in time range)  cangrelor (KENGREAL) 50 MG SOLR (has no administration in time range)  cangrelor [facility]) bolus via infusion (1,803 mcg Intravenous Given 12/28/19 1525)  cangrelor (KENGREAL) 50,000 mcg in sodium chloride 0.9 % 250 mL (200 mcg/mL) infusion (2 mcg/kg/min  120.2 kg Intravenous New Bag/Given 12/28/19 1526)  iohexol (OMNIPAQUE) 350 MG/ML injection 100 mL (100 mLs Intravenous Contrast Given 12/28/19 1159)  metoCLOPramide (REGLAN) injection 10 mg (10 mg Intravenous Given 12/28/19 1228)    ED Course  I have reviewed the triage vital signs and the nursing notes.  Pertinent labs & imaging results that were available during my care of the patient were reviewed by me and considered in my medical decision making (see chart for details).  I reviewed the patient's CT scan, discussed with our neurologist, and on repeat exam the  patient has no vomiting.  Update: I discussed the patient's CT abnormalities with family members to obtain consent for emergent intravascular procedure/thrombolysis.  Update:, Patient in similar condition.  This adult male presents with dizziness for almost 1 day, which was followed by the development of facial asymmetry, and hemiplegia.  Patient found to have substantial abnormalities on CT, with basal artery thrombosis. Patient required emergent intravascular thrombolysis/procedure after ED evaluation/stabilization. Final Clinical Impression(s) / ED Diagnoses Final diagnoses:  Stroke [facility])   MDM Number of Diagnoses or Management Options   Amount and/or Complexity of Data Reviewed Clinical lab tests: reviewed Tests in the radiology  section of CPT: reviewed Tests in the medicine section of CPT: reviewed Discussion of test results with the performing providers: yes Decide to obtain previous medical records or to obtain history from someone other than the patient: yes Obtain history from someone other than the patient: yes Review and summarize past medical records: yes Discuss the patient with other providers: yes Independent visualization of images, tracings, or specimens: yes  Risk of Complications, Morbidity, and/or Mortality Presenting problems: high Diagnostic procedures: high Management options: high  Critical Care Total time providing critical care: 30-74 minutes (35)  Patient Progress Patient progress: stable    Gerhard Munch, MD 12/28/19 1548

## 2019-12-28 NOTE — Sedation Documentation (Signed)
Spoke with [name] in pt placement. Pt to be admitted to 4N26. Confirmed assignment with [name], RN, 4N Charge. Transport requested to bring bed to IR2.

## 2019-12-28 NOTE — Sedation Documentation (Signed)
Pt extubated

## 2019-12-28 NOTE — Transfer of Care (Signed)
Immediate Anesthesia Transfer of Care Note  Patient: [name]  Procedure(s) Performed: IR WITH ANESTHESIA (N/A )  Patient Location: ICU  Anesthesia Type:General  Level of Consciousness: awake and patient cooperative  Airway & Oxygen Therapy: Patient Spontanous Breathing and Patient connected to nasal cannula oxygen  Post-op Assessment: Report given to RN, Post -op Vital signs reviewed and stable and Patient moving all extremities  Post vital signs: Reviewed and stable  Last Vitals:  Vitals Value Taken Time  BP    Temp    Pulse 69 12/28/19 1629  Resp 10 12/28/19 1629  SpO2 100 % 12/28/19 1629  Vitals shown include unvalidated device data.  Last Pain:  Vitals:   12/28/19 1222  TempSrc: Oral         Complications: No complications documented.

## 2019-12-28 NOTE — ED Triage Notes (Signed)
Pt BIB by [facility] for code stroke, dizziness and nausea starting last night with R sided weakness beginning at 1000. Code stroke called en route.

## 2019-12-28 NOTE — Sedation Documentation (Signed)
No admission orders. Message sent to Dr. Thomasena Edis at place them ASAP.

## 2019-12-28 NOTE — Anesthesia Preprocedure Evaluation (Addendum)
Anesthesia Evaluation  Patient identified by MRN, date of birth, ID band Patient awake    Reviewed: Allergy & Precautions, Patient's Chart, lab work & pertinent test results, Unable to perform ROS - Chart review onlyPreop documentation limited or incomplete due to emergent nature of procedure.  Airway Mallampati: II  TM Distance: >3 FB Neck ROM: Full    Dental  (+) Edentulous Upper, Poor Dentition, Dental Advisory Given   Pulmonary    breath sounds clear to auscultation       Cardiovascular  Rhythm:Regular Rate:Tachycardia     Neuro/Psych    GI/Hepatic   Endo/Other    Renal/GU      Musculoskeletal   Abdominal (+) + obese,   Peds  Hematology   Anesthesia Other Findings   Reproductive/Obstetrics                            Anesthesia Physical Anesthesia Plan  ASA: III and emergent  Anesthesia Plan: General   Post-op Pain Management:    Induction: Intravenous, Rapid sequence and Cricoid pressure planned  PONV Risk Score and Plan: 2 and Ondansetron and Treatment may vary due to age or medical condition  Airway Management Planned: Oral ETT and Video Laryngoscope Planned  Additional Equipment: Arterial line  Intra-op Plan:   Post-operative Plan: Possible Post-op intubation/ventilation  Informed Consent: I have reviewed the patients History and Physical, chart, labs and discussed the procedure including the risks, benefits and alternatives for the proposed anesthesia with the patient or authorized representative who has indicated his/her understanding and acceptance.       Plan Discussed with: CRNA  Anesthesia Plan Comments:        Anesthesia Quick Evaluation

## 2019-12-28 NOTE — Sedation Documentation (Addendum)
Spoke with [name] RN, 4N charge. There is a bed available for [name], however, still awaiting admission orders so bed can be assigned.

## 2019-12-28 NOTE — Sedation Documentation (Addendum)
PACU called and made aware of plan to extubate and need for recovery. Will recover on 4N. Will call when leaving IR suite.

## 2019-12-28 NOTE — H&P (Addendum)
Neurology H&P  CC: slurred speech  History is obtained from: EMS  HPI: [name] is a 49 y.o. male states that when he went to bed last night he was feeling nauseated and thought he was coming down with a "stomach bug" however, he woke feeling dizzy and nauseated. He then noticed speech difficulty, right-sided weakness and facial asymmetry and EMS was called.   He was diagnosed with multiple venous occlusions and was started on Eliquis but stopped taking about 2 days ago.   No fever, chills, headache, hearing loss  LKW: about 12 hours ago tpa given?: No, outside the window IR Thrombectomy? Yes Modified Rankin Scale: 1-No significant post stroke disability and can perform usual duties with stroke symptoms NIHSS: 4  ROS: A complete ROS was performed and is negative except as noted in the HPI.   History reviewed. No pertinent past medical history.   No family history on file.  Social History:  has no history on file for tobacco use, alcohol use, and drug use.  Prior to Admission medications   Not on File   Exam: Current vital signs: BP (!) 164/101   Pulse 86   Temp 98.1 F (36.7 C) (Oral)   Resp 19   SpO2 91%    Physical Exam  Constitutional: Appears well-developed and well-nourished.  Psych: Affect appropriate to situation. Eyes: No scleral injection. HENT: No OP obstruction. Head: Normocephalic.  Cardiovascular: Normal rate and regular rhythm.  Respiratory: Effort normal and breath sounds normal to anterior ascultation GI: Soft.  No distension. There is no tenderness.  Skin: WDI  Neuro: Mental Status: Patient is awake, alert, oriented to person, place, month, year, and situation. Patient is able to give a clear and coherent history. No signs of aphasia mild slurred speech (endentulous). Cranial Nerves: II: Visual Fields are full. Pupils are equal, round, and reactive to light.   III,IV, VI: EOMI without ptosis or diploplia.  V: Facial sensation is decrease  in right.  VII: Facial movement is asymmetric right droop.  VIII: hearing is intact to voice X: Uvula elevates symmetrically XI: Shoulder shrug is symmetric. XII: tongue is midline without atrophy or fasciculations.  Motor: Tone is normal. Bulk is normal. 4/5 strength on right.  Sensory: Sensation is asymmetric decreased on right to light touch and temperature in the arms and legs. Deep Tendon Reflexes: mildly brisk on right.  Plantars: Toes are downgoing bilaterally.  Cerebellar: FNF and HKS are intact bilaterally  I have reviewed the images obtained: NCT Head showed possible acute/subacute right cerebellar infarct. Mild apparent hyperdensity of the basilar artery. CTA head and neck thrombus within the proximal to mid basilar artery.  CTP: CBF (<30%) Volume: 0 mL, perfusion (Tmax>6.0s) volume: .  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  After initial head CT scan, I re-evaluated patient and he had forced right gaze deviation with up-beating nystagmus, dysarthria and flaccid in right extremities. NIHSS 17.  Symptoms consistent with basilar artery occlusion. Discussed with neurointervention and the patient was taken for procedure. Also discussed with family.  Post procedure admit to neuro ICU Post thrombectomy monitoring protocol SBP 140-160 CT at 4 hours after starting cangrelor or sooner if neurological deterioration Blood pressure management per post tPA-protocol order set.  Gentle IV hydration.  PT/OT/Speech. MRI of brain and TTE.   This patient is critically ill and at significant risk of neurological worsening, death and care requires constant monitoring of vital signs, hemodynamics,respiratory and  cardiac monitoring, neurological assessment, discussion with family, other specialists and medical decision making of high complexity. I spent 75 minutes of neurocritical care time  in the care of  this patient. This was time spent  independent of any time provided by nurse practitioner or PA.  Electronically signed by: Dr. Marisue Humble Pager: [phone] 12/28/2019, 2:40 PM

## 2019-12-28 NOTE — Anesthesia Postprocedure Evaluation (Signed)
Anesthesia Post Note  Patient: VASILIOS OTTAWAY  Procedure(s) Performed: IR WITH ANESTHESIA (N/A )     Patient location during evaluation: ICU Anesthesia Type: General Level of consciousness: awake Pain management: pain level controlled Vital Signs Assessment: post-procedure vital signs reviewed and stable Respiratory status: spontaneous breathing, nonlabored ventilation, respiratory function stable and patient connected to nasal cannula oxygen Cardiovascular status: blood pressure returned to baseline and stable Postop Assessment: no apparent nausea or vomiting Anesthetic complications: no   No complications documented.  Last Vitals:  Vitals:   12/28/19 1800 12/28/19 1815  BP: 130/80   Pulse: 71 74  Resp: 11 17  Temp:    SpO2: 100% 99%    Last Pain:  Vitals:   12/28/19 1630  TempSrc: Oral                 Shelton Silvas

## 2019-12-28 NOTE — Anesthesia Procedure Notes (Signed)
Procedure Name: Intubation Date/Time: 12/28/2019 1:12 PM Performed by: Shireen Quan, CRNA Pre-anesthesia Checklist: Patient identified, Emergency Drugs available, Suction available and Patient being monitored Patient Re-evaluated:Patient Re-evaluated prior to induction Oxygen Delivery Method: Circle System Utilized Preoxygenation: Pre-oxygenation with 100% oxygen Induction Type: IV induction, Rapid sequence and Cricoid Pressure applied Laryngoscope Size: Glidescope and 4 Tube type: Oral Number of attempts: 1 Airway Equipment and Method: Rigid stylet and Video-laryngoscopy Placement Confirmation: ETT inserted through vocal cords under direct vision,  positive ETCO2 and breath sounds checked- equal and bilateral Secured at: 23 cm Tube secured with: Tape Dental Injury: Teeth and Oropharynx as per pre-operative assessment

## 2019-12-28 NOTE — Sedation Documentation (Signed)
SBAR given to Asher Muir, Charity fundraiser at bedside upon arrival to [phone]. All questions answered to satisfaction. Groin and pulses unchanged, see flow sheet.

## 2019-12-28 NOTE — Procedures (Signed)
S/P  Lt Common carotid and Lt Vertebral artrriograms . RT CFA approach. Findings. 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 S/P complete revascularization withx1 pass with 62mm x 37 mm embotrap retriever and aspiration followed by stent assisted angioplasty  Achieving a TICI 3 revascularization. Post CT brain .No ICH or mass effect. No hydrocephalus. hemostasis in the rt groin with a 80F exoseal .  Groin soft. Distal pulses all dopplerable. Extubated. Obeying commands appropriately.Slurred speech. Moving all 4s with mild weakness imn the RT UE. Pupils 56mm Rt = LT. Absent horizontal gaze to the left.No Nystagmus.Says sees better in the Lt VF. Tongue midline. S.Alainna Stawicki MD

## 2019-12-28 NOTE — Progress Notes (Signed)
Patient ID: [name], male   DOB: 1970/05/01, 49 y.o.   MRN: [id number] INR. 49 Y RT M  MRSS 0-1 .LSWaproc 12 hrs earlier. Woke up with dizziness,N and vomiting.and slurred speech. Rt sided weakness and facial asymmetry. CT   brain No ICH ASPECTS 10.  CTA Near occlusive long seg filling defects in the prox and mid basilar artery. Endovascular treatment D/W patient and mother. Procedure,reasonsand alternatives reviewed. Risks of ICH of 10 %,worsening neuro deficit death and inability to revascularize reviewed. They expressed understanding and provided consent for treatment. S.Jary Louvier MD

## 2019-12-29 ENCOUNTER — Encounter (HOSPITAL_COMMUNITY): Payer: Self-pay | Admitting: Radiology

## 2019-12-29 ENCOUNTER — Inpatient Hospital Stay (HOSPITAL_COMMUNITY): Payer: BC Managed Care – PPO

## 2019-12-29 DIAGNOSIS — I6312 Cerebral infarction due to embolism of basilar artery: Secondary | ICD-10-CM

## 2019-12-29 DIAGNOSIS — I10 Essential (primary) hypertension: Secondary | ICD-10-CM

## 2019-12-29 DIAGNOSIS — I513 Intracardiac thrombosis, not elsewhere classified: Secondary | ICD-10-CM

## 2019-12-29 DIAGNOSIS — D689 Coagulation defect, unspecified: Secondary | ICD-10-CM

## 2019-12-29 DIAGNOSIS — I6389 Other cerebral infarction: Secondary | ICD-10-CM

## 2019-12-29 LAB — CBC WITH DIFFERENTIAL/PLATELET
Abs Immature Granulocytes: 0.16 10*3/uL — ABNORMAL HIGH (ref 0.00–0.07)
Basophils Absolute: 0 10*3/uL (ref 0.0–0.1)
Basophils Relative: 0 %
Eosinophils Absolute: 0 10*3/uL (ref 0.0–0.5)
Eosinophils Relative: 0 %
HCT: 46.8 % (ref 39.0–52.0)
Hemoglobin: 15.5 g/dL (ref 13.0–17.0)
Immature Granulocytes: 1 %
Lymphocytes Relative: 7 %
Lymphs Abs: 1.3 10*3/uL (ref 0.7–4.0)
MCH: 29 pg (ref 26.0–34.0)
MCHC: 33.1 g/dL (ref 30.0–36.0)
MCV: 87.5 fL (ref 80.0–100.0)
Monocytes Absolute: 0.9 10*3/uL (ref 0.1–1.0)
Monocytes Relative: 5 %
Neutro Abs: 16.6 10*3/uL — ABNORMAL HIGH (ref 1.7–7.7)
Neutrophils Relative %: 87 %
Platelets: 297 10*3/uL (ref 150–400)
RBC: 5.35 MIL/uL (ref 4.22–5.81)
RDW: 14.6 % (ref 11.5–15.5)
WBC: 19.1 10*3/uL — ABNORMAL HIGH (ref 4.0–10.5)
nRBC: 0 % (ref 0.0–0.2)

## 2019-12-29 LAB — BASIC METABOLIC PANEL
Anion gap: 11 (ref 5–15)
BUN: 9 mg/dL (ref 6–20)
CO2: 22 mmol/L (ref 22–32)
Calcium: 8.9 mg/dL (ref 8.9–10.3)
Chloride: 103 mmol/L (ref 98–111)
Creatinine, Ser: 0.96 mg/dL (ref 0.61–1.24)
GFR calc Af Amer: >60 mL/min (ref >60)
GFR calc non Af Amer: >60 mL/min (ref >60)
Glucose, Bld: 138 mg/dL — ABNORMAL HIGH (ref 70–99)
Potassium: 3.9 mmol/L (ref 3.5–5.1)
Sodium: 136 mmol/L (ref 135–145)

## 2019-12-29 LAB — HIV ANTIBODY (ROUTINE TESTING W REFLEX): HIV Screen 4th Generation wRfx: NONREACTIVE

## 2019-12-29 LAB — ECHOCARDIOGRAM COMPLETE
AR max vel: 3.81 cm2
AV Area VTI: 3.87 cm2
AV Area mean vel: 3.69 cm2
AV Mean grad: 5.1 mmHg
AV Peak grad: 9.7 mmHg
Ao pk vel: 1.56 m/s
Area-P 1/2: 4.06 cm2
Calc EF: 71.3 %
S' Lateral: 3.4 cm
Single Plane A2C EF: 79 %
Single Plane A4C EF: 59.1 %
Weight: 4240 oz

## 2019-12-29 LAB — TSH: TSH: 0.837 u[IU]/mL (ref 0.350–4.500)

## 2019-12-29 LAB — VITAMIN B12: Vitamin B-12: 229 pg/mL (ref 180–914)

## 2019-12-29 LAB — HEPARIN LEVEL (UNFRACTIONATED): Heparin Unfractionated: 0.2 IU/mL — ABNORMAL LOW (ref 0.30–0.70)

## 2019-12-29 LAB — RAPID URINE DRUG SCREEN, HOSP PERFORMED
Amphetamines: NOT DETECTED
Barbiturates: NOT DETECTED
Benzodiazepines: NOT DETECTED
Cocaine: NOT DETECTED
Opiates: NOT DETECTED
Tetrahydrocannabinol: NOT DETECTED

## 2019-12-29 LAB — C-REACTIVE PROTEIN: CRP: 1 mg/dL — ABNORMAL HIGH (ref <1.0)

## 2019-12-29 LAB — HEMOGLOBIN A1C
Hgb A1c MFr Bld: 6 % — ABNORMAL HIGH (ref 4.8–5.6)
Mean Plasma Glucose: 125.5 mg/dL

## 2019-12-29 LAB — SEDIMENTATION RATE: Sed Rate: 5 mm/hr (ref 0–16)

## 2019-12-29 LAB — APTT: aPTT: 46 seconds — ABNORMAL HIGH (ref 24–36)

## 2019-12-29 MED ORDER — HYDROCHLOROTHIAZIDE 25 MG PO TABS
25.0000 mg | ORAL_TABLET | Freq: Every day | ORAL | Status: DC
  Administered 2019-12-29 – 2020-01-01 (×4): 25 mg via ORAL
  Filled 2019-12-29 (×4): qty 1

## 2019-12-29 MED ORDER — NIFEDIPINE ER OSMOTIC RELEASE 60 MG PO TB24
60.0000 mg | ORAL_TABLET | Freq: Every day | ORAL | Status: DC
  Administered 2019-12-29 – 2020-01-01 (×4): 60 mg via ORAL
  Filled 2019-12-29 (×5): qty 1

## 2019-12-29 MED ORDER — PERFLUTREN LIPID MICROSPHERE
1.0000 mL | INTRAVENOUS | Status: DC | PRN
  Administered 2019-12-29: 2 mL via INTRAVENOUS
  Filled 2019-12-29: qty 10

## 2019-12-29 MED ORDER — HEPARIN (PORCINE) 25000 UT/250ML-% IV SOLN
1850.0000 [IU]/h | INTRAVENOUS | Status: DC
  Administered 2019-12-29: 1350 [IU]/h via INTRAVENOUS
  Administered 2019-12-30: 1500 [IU]/h via INTRAVENOUS
  Administered 2019-12-31: 1750 [IU]/h via INTRAVENOUS
  Administered 2020-01-01: 1850 [IU]/h via INTRAVENOUS
  Filled 2019-12-29 (×5): qty 250

## 2019-12-29 MED ORDER — LOSARTAN POTASSIUM 50 MG PO TABS
50.0000 mg | ORAL_TABLET | Freq: Every day | ORAL | Status: DC
  Administered 2019-12-29 – 2020-01-01 (×4): 50 mg via ORAL
  Filled 2019-12-29 (×4): qty 1

## 2019-12-29 MED ORDER — FOLIC ACID 1 MG PO TABS
1.0000 mg | ORAL_TABLET | Freq: Every day | ORAL | Status: DC
  Administered 2019-12-29 – 2020-01-01 (×4): 1 mg via ORAL
  Filled 2019-12-29 (×4): qty 1

## 2019-12-29 NOTE — Progress Notes (Signed)
  Echocardiogram 2D Echocardiogram with contrast has been performed.  Roosvelt Maser F 12/29/2019, 10:12 AM

## 2019-12-29 NOTE — Progress Notes (Signed)
[facility] [address], [place]  Dr.Poras Coralyn Pear [phone]  [phone]
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
## 2019-12-29 NOTE — Evaluation (Signed)
Physical Therapy Evaluation Patient Details Name: [name] MRN: [id number] DOB: 1970/09/14 Today's Date: 12/29/2019   History of Present Illness  Pt is a 49 y.o. male who presented to the ED with nausea, dizziness, speech difficulty, right-sided weakness and facial asymmetry. He has a history of multiple venous occlusions and was started on Eliquis but stopped taking it on Wednesday, 9/29. CT revealed posterior infarcts with BA thrombus. Pt s/p thrombectomy and vascular stent from the dominant distal left vertebral through the proximal basilar. Repeat CT following IR procedures showed new small bilateral occipital pole cortical infarcts suspected but stable CT appearance of mild right PICA infarct, and no associated hemorrhage or mass effect.    Clinical Impression  Pt admitted with above diagnosis. PTA pt active and independent. On eval, he required min assist bed mobility, min assist transfers, and min assist marching in place bedside. Mild weakness noted RLE, as well as decreased sensation and proprioception. Nausea and dizziness elicited by stance requiring return to supine. Visual deficits noted, including inability to cross midline to the left and blurry vision. Pt will benefit from skilled PT to increase their independence and safety with mobility to allow discharge to the venue listed below.       Follow Up Recommendations CIR    Equipment Recommendations  Other (comment) (TBD)    Recommendations for Other Services Rehab consult     Precautions / Restrictions Precautions Precautions: Fall      Mobility  Bed Mobility Overal bed mobility: Needs Assistance Bed Mobility: Rolling;Supine to Sit;Sit to Supine Rolling: Min assist   Supine to sit: Min assist;HOB elevated Sit to supine: Min assist;HOB elevated   General bed mobility comments: +rail, increased time, cues for sequencing  Transfers Overall transfer level: Needs assistance Equipment used: 1 person hand held  assist Transfers: Sit to/from Stand Sit to Stand: Min assist         General transfer comment: sit to stand x 2 trials, assist to power up and stabilize balance  Ambulation/Gait             General Gait Details: marching in place bedside with minimal foot clearance bilat. Pt reports the marching elicits nausea/dizziness. Min assist with pt using therapists bilat forearms for support. Unsteady. Pt reports R knee feels unstable, like it may buckling.  Stairs            Wheelchair Mobility    Modified Rankin (Stroke Patients Only) Modified Rankin (Stroke Patients Only) Pre-Morbid Rankin Score: No symptoms Modified Rankin: Moderately severe disability     Balance Overall balance assessment: Needs assistance Sitting-balance support: Feet supported;No upper extremity supported Sitting balance-Leahy Scale: Fair     Standing balance support: During functional activity;Bilateral upper extremity supported;No upper extremity supported Standing balance-Leahy Scale: Poor Standing balance comment: reliant on external support                             Pertinent Vitals/Pain Pain Assessment: Faces Faces Pain Scale: Hurts a little bit Pain Location: mild headache Pain Descriptors / Indicators: Headache Pain Intervention(s): Monitored during session;Limited activity within patient's tolerance;Repositioned    Home Living Family/patient expects to be discharged to:: Private residence Living Arrangements: Alone Available Help at Discharge: Family;Available 24 hours/day Type of Home: House Home Access: Stairs to enter   [employer] of Steps: 3-4 Home Layout: One level Home Equipment: None      Prior Function Level of Independence: Independent  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[employer], counselling        Extremity/Trunk Assessment   Upper Extremity Assessment Upper Extremity Assessment: Defer to OT evaluation    Lower Extremity  Assessment Lower Extremity Assessment: RLE deficits/detail;LLE deficits/detail RLE Deficits / Details: tingling sensation with palpation, 4/5 strength RLE Sensation: decreased proprioception LLE Deficits / Details: 5/5, sensation intact    Cervical / Trunk Assessment Cervical / Trunk Assessment: Normal  Communication   Communication: No difficulties (mildly slurred speech)  Cognition Arousal/Alertness: Awake/alert Behavior During Therapy: WFL for tasks assessed/performed Overall Cognitive Status: Within Functional Limits for tasks assessed                                        General Comments General comments (skin integrity, edema, etc.): Unable to visually cross midline to left. Pt reports burry vision. BP 121/81 supine in bed and 143/82 sitting EOB after standing/marching. BP 128/78 after return to supine.    Exercises     Assessment/Plan    PT Assessment Patient needs continued PT services  PT Problem List Decreased mobility;Decreased strength;Decreased coordination;Pain;Decreased balance;Decreased activity tolerance;Decreased knowledge of use of DME       PT Treatment Interventions DME instruction;Therapeutic activities;Gait training;Therapeutic exercise;Patient/family education;Balance training;Stair training;Functional mobility training;Neuromuscular re-education    PT Goals (Current goals can be found in the Care Plan section)  Acute Rehab PT Goals Patient Stated Goal: independence PT Goal Formulation: With patient Time For Goal Achievement: 01/12/20 Potential to Achieve Goals: Good    Frequency Min 4X/week   Barriers to discharge        Co-evaluation               AM-PAC PT "6 Clicks" Mobility  Outcome Measure Help needed turning from your back to your side while in a flat bed without using bedrails?: A Little Help needed moving from lying on your back to sitting on the side of a flat bed without using bedrails?: A Little Help needed  moving to and from a bed to a chair (including a wheelchair)?: A Little Help needed standing up from a chair using your arms (e.g., wheelchair or bedside chair)?: A Little Help needed to walk in hospital room?: A Lot Help needed climbing 3-5 steps with a railing? : A Lot 6 Click Score: 16    End of Session Equipment Utilized During Treatment: Gait belt Activity Tolerance: Patient tolerated treatment well Patient left: in bed;with call bell/phone within reach;with bed alarm set;with family/visitor present Nurse Communication: Mobility status PT Visit Diagnosis: Unsteadiness on feet (R26.81);Other abnormalities of gait and mobility (R26.89)    Time: 5597-4163 PT Time Calculation (min) (ACUTE ONLY): 21 min   Charges:   PT Evaluation $PT Eval Moderate Complexity: 1 Mod          Aida Raider, PT  Office # [phone] Pager [phone]   Ilda Foil 12/29/2019, 1:43 PM

## 2019-12-29 NOTE — Progress Notes (Addendum)
ANTICOAGULATION CONSULT NOTE - Initial Consult  Pharmacy Consult for heparin Indication: LV thrombus  No Known Allergies  Patient Measurements: Height: 5\' 11"  (180.3 cm) Weight: 120.2 kg (265 lb) IBW/kg (Calculated) : 75.3 Heparin Dosing Weight: 102 kg  Vital Signs: Temp: 98.7 F (37.1 C) (10/03 0800) Temp Source: Oral (10/03 0800) BP: 124/74 (10/03 1115) Pulse Rate: 85 (10/03 1115)  Labs: Recent Labs    12/28/19 1141 12/28/19 1141 12/28/19 1143 12/29/19 0500  HGB 18.1*   < > 19.7* 15.5  HCT 56.0*  --  58.0* 46.8  PLT 338  --   --  297  APTT 29  --   --   --   LABPROT 13.0  --   --   --   INR 1.0  --   --   --   CREATININE 1.33*  --  1.20 0.96   < > = values in this interval not displayed.    Estimated Creatinine Clearance: 124.2 mL/min (by C-G formula based on SCr of 0.96 mg/dL).   Medical History: History reviewed. No pertinent past medical history.  Medications:  Medications Prior to Admission  Medication Sig Dispense Refill Last Dose   Cyanocobalamin (VITAMIN B12 PO) Take 1 tablet by mouth daily.   Past Week at Unknown time   ELIQUIS 5 MG TABS tablet Take 5 mg by mouth in the morning and at bedtime.   Past Week at Unknown time   folic acid (FOLVITE) 1 MG tablet Take 1 mg by mouth daily.   Past Week at Unknown time   hydrochlorothiazide (HYDRODIURIL) 25 MG tablet Take 25 mg by mouth daily.   Past Week at Unknown time   losartan (COZAAR) 50 MG tablet Take 50 mg by mouth daily.   Past Week at Unknown time   NIFEdipine (ADALAT CC) 60 MG 24 hr tablet Take 60 mg by mouth daily.   Past Week at Unknown time   omeprazole (PRILOSEC) 20 MG capsule Take 20 mg by mouth daily.   Past Week at Unknown time    Assessment: 50 yom admitted for acute stroke and found to have basilar artery occlusion - no tPA. He is s/p complete revascularization and stent in IR on 10/2. He was on apixaban PTA for abdominal vessel clots but stopped taking it 2d PTA. Echo completed and  shows LV thrombus. Pharmacy consulted to begin IV heparin. No bleeding noted, CBC is normal, baseline aPTT 29. Last heparin SQ dose was this am.  Goal of Therapy:  Heparin level 0.3-0.5 units/ml Monitor platelets by anticoagulation protocol: Yes   Plan:  Begin heparin drip at 1350 units/hr with no bolus 6 hr heparin level and aPTT in the event apixaban still affecting heparin level Daily heparin level, aPTT, CBC Monitor for s/sx of bleeding  Thank you for involving pharmacy in this patient's care.  12/2, PharmD, BCPS Clinical Pharmacist Clinical phone for 12/29/2019 until 3p is 02/28/2020 12/29/2019 11:25 AM  **Pharmacist phone directory can be found on [email or web address] listed under [facility]**

## 2019-12-29 NOTE — Progress Notes (Signed)
ANTICOAGULATION CONSULT NOTE  Pharmacy Consult for heparin Indication: LV thrombus  No Known Allergies  Patient Measurements: Height: 5\' 11"  (180.3 cm) Weight: 120.2 kg (264 lb 15.9 oz) IBW/kg (Calculated) : 75.3 Heparin Dosing Weight: 102 kg  Vital Signs: Temp: 98.4 F (36.9 C) (10/03 2000) Temp Source: Oral (10/03 2000) BP: 137/82 (10/03 2100) Pulse Rate: 73 (10/03 2100)  Labs: Recent Labs    12/28/19 1141 12/28/19 1141 12/28/19 1143 12/29/19 0500 12/29/19 2108  HGB 18.1*   < > 19.7* 15.5  --   HCT 56.0*  --  58.0* 46.8  --   PLT 338  --   --  297  --   APTT 29  --   --   --  46*  LABPROT 13.0  --   --   --   --   INR 1.0  --   --   --   --   HEPARINUNFRC  --   --   --   --  0.20*  CREATININE 1.33*  --  1.20 0.96  --    < > = values in this interval not displayed.    Estimated Creatinine Clearance: 124.2 mL/min (by C-G formula based on SCr of 0.96 mg/dL).   Medical History: History reviewed. No pertinent past medical history.  Medications:  Medications Prior to Admission  Medication Sig Dispense Refill Last Dose  . Cyanocobalamin (VITAMIN B12 PO) Take 1 tablet by mouth daily.   Past Week at Unknown time  . ELIQUIS 5 MG TABS tablet Take 5 mg by mouth in the morning and at bedtime.   Past Week at Unknown time  . folic acid (FOLVITE) 1 MG tablet Take 1 mg by mouth daily.   Past Week at Unknown time  . hydrochlorothiazide (HYDRODIURIL) 25 MG tablet Take 25 mg by mouth daily.   Past Week at Unknown time  . losartan (COZAAR) 50 MG tablet Take 50 mg by mouth daily.   Past Week at Unknown time  . NIFEdipine (ADALAT CC) 60 MG 24 hr tablet Take 60 mg by mouth daily.   Past Week at Unknown time  . omeprazole (PRILOSEC) 20 MG capsule Take 20 mg by mouth daily.   Past Week at Unknown time    Assessment: 3 yom admitted for acute stroke and found to have basilar artery occlusion - no tPA. He is s/p complete revascularization and stent in IR on 10/2. He was on apixaban  PTA for abdominal vessel clots but stopped taking it 2d PTA. Echo completed and shows LV thrombus. Pharmacy consulted to begin IV heparin. No bleeding noted, CBC is normal, baseline aPTT 29. Last heparin SQ dose was this am.  Initial heparin level subtherapeutic at 0.2, and aPTT also subtherapeutic at 45 seconds.  Anti-Xa level appears correlated consistent with timing of last apixaban dose, will follow anti-Xa levels from now on.    Goal of Therapy:  Heparin level 0.3-0.5 units/ml Monitor platelets by anticoagulation protocol: Yes   Plan:  Increase heparin gtt to 1500 units/hr F/u 6 hour heparin level  12/2, PharmD Clinical Pharmacist Please check AMION for all [facility] numbers 12/29/2019 9:56 PM

## 2019-12-29 NOTE — Progress Notes (Signed)
Referring Physician(s): Code stroke  Supervising Physician: Julieanne Cotton  Patient Status:  [facility] - In-pt  Chief Complaint: Follow up left basilar artery thrombectomy yesterday in NIR  Subjective:  Patient sleeping upon entry to room but arouses easily to verbal cues. He reports back pain from laying in bed but otherwise feels fine. He ate breakfast this morning and is waiting for lunch.   Allergies: Patient has no known allergies.  Medications: Prior to Admission medications   Medication Sig Start Date End Date Taking? Authorizing Provider  Cyanocobalamin (VITAMIN B12 PO) Take 1 tablet by mouth daily.   Yes [provider]  ELIQUIS 5 MG TABS tablet Take 5 mg by mouth in the morning and at bedtime. 10/11/19  Yes [provider]  folic acid (FOLVITE) 1 MG tablet Take 1 mg by mouth daily. 11/10/19  Yes [provider]  hydrochlorothiazide (HYDRODIURIL) 25 MG tablet Take 25 mg by mouth daily. 11/08/19  Yes [provider]  losartan (COZAAR) 50 MG tablet Take 50 mg by mouth daily. 11/03/19  Yes [provider]  NIFEdipine (ADALAT CC) 60 MG 24 hr tablet Take 60 mg by mouth daily. 11/08/19  Yes [provider]  omeprazole (PRILOSEC) 20 MG capsule Take 20 mg by mouth daily.   Yes [provider]     Vital Signs: BP 136/83   Pulse 83   Temp 98.7 F (37.1 C) (Oral)   Resp 18   Ht  (1.803 m)   Wt 264 lb 15.9 oz (120.2 kg)   SpO2 96%   BMI 36.96 kg/m   Physical Exam Vitals and nursing note reviewed.  HENT:     Head: Normocephalic.  Cardiovascular:     Rate and Rhythm: Normal rate.     Pulses: Normal pulses.     Comments: (+) right CFA puncture site with some dried blood present but no active bleeding. Soft, mildly tender, non pulsatile.  Pulmonary:     Effort: Pulmonary effort is normal.  Abdominal:     Palpations: Abdomen is soft.  Skin:    General: Skin is warm and dry.  Neurological:     Mental  Status: He is alert and oriented to person, place, and time.     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 Subsequent encounter. EXAM: CT HEAD WITHOUT CONTRAST TECHNIQUE: Contiguous axial images were obtained from the base of the skull through the vertex without intravenous contrast. COMPARISON:  CT head 1144 hours today. CTA and CT Perfusion earlier today. FINDINGS: Brain: No acute intracranial hemorrhage identified. No midline shift, mass effect, or evidence of intracranial mass lesion. No ventriculomegaly. Stable since this morning and relatively mild patchy hypodensity in the right cerebellar PICA territory (series 3, image 29). Gray-white matter differentiation in the deep gray nuclei, the brainstem and the left cerebellar hemisphere appears stable and within normal limits. There are possible small bilateral occipital pole cortical infarcts (series 3, image 22). But elsewhere supratentorial gray-white matter differentiation is stable. Vascular: New distal left vertebral artery through proximal basilar vascular stent. Possible mild residual intravascular contrast. No suspicious intracranial vascular hyperdensity. Skull: No acute osseous abnormality identified. Sinuses/Orbits: Stable paranasal sinuses, maxillary sinus mucous retention cysts. Tympanic cavities and mastoids remain clear. Other: No acute orbit or scalp soft tissue finding. IMPRESSION: 1. New small bilateral Occipital pole cortical infarcts suspected. But stable CT appearance of mild  Right PICA infarct since this morning, and no associated hemorrhage or mass effect. 2. And elsewhere stable CT appearance of the brain status post endovascular treatment of Basilar Artery thrombosis. 3. New vascular stent from the dominant distal left vertebral through the proximal basilar. Electronically Signed   By: Odessa Fleming  M.D.   On: 12/28/2019 20:13   CT CEREBRAL PERFUSION W CONTRAST  Addendum Date: 12/28/2019   ADDENDUM REPORT: 12/28/2019 12:29 ADDENDUM: CORRECTION. In the findings it mentions infarct in the posterior circulation in the perfusion section. This was a typo. There is no core infarct on perfusion, but there is a large area of penumbra. This was clarified with Dr. Thomasena Edis at 12:28 PM via telephone. Electronically Signed   By: Feliberto Harts MD   On: 12/28/2019 12:29   Result Date: 12/28/2019 CLINICAL DATA:  Right-sided weakness, blurry vision, vomiting, headaches. EXAM: CT ANGIOGRAPHY HEAD AND NECK CT PERFUSION BRAIN TECHNIQUE: Multidetector CT imaging of the head and neck was performed using the standard protocol during bolus administration of intravenous contrast. Multiplanar CT image reconstructions and MIPs were obtained to evaluate the vascular anatomy. Carotid stenosis measurements (when applicable) are obtained utilizing NASCET criteria, using the distal internal carotid diameter as the denominator. Multiphase CT imaging of the brain was performed following IV bolus contrast injection. Subsequent parametric perfusion maps were calculated using RAPID software. CONTRAST:  OMNIPAQUE IOHEXOL 350 MG/ML SOLN COMPARISON:  None. FINDINGS: CTA NECK FINDINGS Aortic arch: Imaged portion shows no evidence of aneurysm or dissection. No significant stenosis of the major arch vessel origins. Right carotid system: Mild narrowing of the proximal right ICA, likely related to atherosclerosis. No evidence of greater than 50% narrowing or occlusion. Left carotid system: No evidence of greater than 50% stenosis or occlusion. Vertebral arteries: Left dominant. No evidence of greater than 50% stenosis or occlusion. Possible tiny filling defect within the left V4 vertebral artery at the level of the PICA origin (series 7, image 146). The right vertebral artery is diminutive throughout its course. Skeleton: Mild multilevel  degenerative changes of the cervical spine. Other neck: No mass or adenopathy. Unremarkable appearance of the thyroid gland. Upper chest: Paraseptal and centrilobular emphysema. No consolidation. Review of the MIP images confirms the above findings CTA HEAD FINDINGS Anterior circulation: No evidence of greater than 50% stenosis or occlusion. Bilateral M1 and proximal M2 MCA branches appear widely patent. Diminutive right A1 ACA, likely congenital variation given large left A1 ACA. Bilateral A2 ACA is are well opacified. No evidence of aneurysm. Posterior circulation: There is focal filling defect within the proximal to mid severe narrowing of the artery. Basilar artery, compatible with intraluminal thrombus which results in severe narrowing. Thrombus does not appear to extend to the superior cerebellar or posterior cerebral origins. No evidence of greater than 50% narrowing of the posterior cerebral arteries. No evidence of aneurysm. Venous sinuses: Limited visualization given contrast bolus timing. Review of the MIP images confirms the above findings CT Brain Perfusion Findings: CBF (<30%) Volume: 0 mLmL Perfusion (Tmax>6.0s) volume: Mismatch Volume: Infarction Location:Posterior circulation, including cerebellum and occipital lobes. IMPRESSION: 1. 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 Possible small amount of thrombus in the left V4 vertebral artery. 2. Perfusion imaging demonstrates a large area of penumbra involving the posterior circulation without core infarct. Code stroke imaging results were communicated on 12/28/2019 at 12:01 p.m. to provider Dr. Thomasena Edis via telephone, who verbally acknowledged these results. Electronically  Signed: By: Feliberto Harts MD On: 12/28/2019 12:17   ECHOCARDIOGRAM COMPLETE  Result Date: 12/29/2019    ECHOCARDIOGRAM REPORT   Patient Name:   Amore Karsten Ro Date of Exam: 12/29/2019  Medical Rec #:  [id number]    Height: Accession #:    [id number]   Weight:       265.0 lb Date of Birth:  [birth year]    BSA:          2.436 m Patient Age:    49 years     BP:           131/78 mmHg Patient Gender: M            HR:           79 bpm. Exam Location:  Inpatient Procedure: 2D Echo, Cardiac Doppler, Color Doppler and Intracardiac            Opacification Agent Indications:    Stroke 434.91/II63.9  History:        Patient has no prior history of Echocardiogram examinations.  Sonographer:    Roosvelt Maser RDCS Referring Phys: [id number] HUNTER J COLLINS IMPRESSIONS  1. 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 1 x 1.8 cm apical density/filling defect on contrast imaging that is semi mobile consistent with apical thrombus. . Left ventricular ejection fraction, by estimation, is 55 to 60%. The left ventricle has normal function. The left ventricle demonstrates regional wall motion abnormalities (see scoring diagram/findings for description). There is severe left ventricular hypertrophy. Left ventricular diastolic parameters are consistent with Grade I diastolic dysfunction (impaired  relaxation). Elevated left atrial pressure. There is akinesis of the left ventricular, apical segment.  2. Right ventricular systolic function is normal. The right ventricular size is normal.  3. The mitral valve is normal in structure. No evidence of mitral valve regurgitation. No evidence of mitral stenosis.  4. The aortic valve is tricuspid. Aortic valve regurgitation is not visualized. No aortic stenosis is present.  5. Apical thrombus reported to Dr Roda Shutters through epic direct messaging. FINDINGS  Left Ventricle: The apex is akinetic. 1 x 1.8 cm apical density/filling defect on contrast imaging that is semi mobile consistent with apical thrombus. Left ventricular ejection fraction, by estimation, is 55 to 60%. The left ventricle has normal function. The left ventricle demonstrates regional wall motion abnormalities. Definity contrast agent was given IV to  delineate the left ventricular endocardial borders. The left ventricular internal cavity size was normal in size. There is severe left ventricular hypertrophy. Left ventricular diastolic parameters are consistent with Grade I diastolic dysfunction (impaired relaxation). Elevated left atrial pressure. Right Ventricle: The right ventricular size is normal. No increase in right ventricular wall thickness. Right ventricular systolic function is normal. Left Atrium: Left atrial size was normal in size. Right Atrium: Right atrial size was normal in size. Pericardium: There is no evidence of pericardial effusion. Mitral Valve: The mitral valve is normal in structure. No evidence of mitral valve regurgitation. No evidence of mitral valve stenosis. Tricuspid Valve: The tricuspid valve is normal in structure. Tricuspid valve regurgitation is not demonstrated. No evidence of tricuspid stenosis. Aortic Valve: The aortic valve is tricuspid. Aortic valve regurgitation is not visualized. No aortic stenosis is present. Aortic valve mean gradient measures 5.1 mmHg. Aortic valve peak gradient measures 9.7 mmHg. Aortic valve area, by VTI measures 3.87 cm. Pulmonic Valve: The pulmonic valve was not well visualized. Pulmonic valve regurgitation is not visualized. No evidence of pulmonic stenosis. Aorta: The aortic root is normal in  size and structure. Pulmonary Artery: Indeterminant PASP, inadequate TR jet. IAS/Shunts: No atrial level shunt detected by color flow Doppler.  LEFT VENTRICLE PLAX 2D LVIDd:         5.10 cm      Diastology LVIDs:         3.40 cm      LV e' medial:    4.35 cm/s LV PW:         1.50 cm      LV E/e' medial:  17.8 LV IVS:        1.50 cm      LV e' lateral:   4.46 cm/s LVOT diam:     2.50 cm      LV E/e' lateral: 17.4 LV SV:         107 LV SV Index:   44 LVOT Area:     4.91 cm  LV Volumes (MOD) LV vol d, MOD A2C: 146.0 ml LV vol d, MOD A4C: 58.0 ml LV vol s, MOD A2C: 30.6 ml LV vol s, MOD A4C: 23.7 ml LV SV MOD  A2C:     115.4 ml LV SV MOD A4C:     58.0 ml LV SV MOD BP:      65.5 ml RIGHT VENTRICLE RV Basal diam:  3.90 cm LEFT ATRIUM              Index       RIGHT ATRIUM           Index LA diam:        4.70 cm  1.93 cm/m  RA Area:     26.80 cm LA Vol (A2C):   120.0 ml 49.26 ml/m RA Volume:   85.80 ml  35.22 ml/m LA Vol (A4C):   88.2 ml  36.21 ml/m LA Biplane Vol: 108.0 ml 44.33 ml/m  AORTIC VALVE AV Area (Vmax):    3.81 cm AV Area (Vmean):   3.69 cm AV Area (VTI):     3.87 cm AV Vmax:           156.10 cm/s AV Vmean:          105.627 cm/s AV VTI:            0.275 m AV Peak Grad:      9.7 mmHg AV Mean Grad:      5.1 mmHg LVOT Vmax:         121.27 cm/s LVOT Vmean:        79.349 cm/s LVOT VTI:          0.217 m LVOT/AV VTI ratio: 0.79  AORTA Ao Root diam: 3.60 cm Ao Asc diam:  3.60 cm MITRAL VALVE MV Area (PHT): 4.06 cm     SHUNTS MV Decel Time: 187 msec     Systemic VTI:  0.22 m MV E velocity: 77.60 cm/s   Systemic Diam: 2.50 cm MV A velocity: 109.00 cm/s MV E/A ratio:  0.71 Dina Rich MD Electronically signed by Dina Rich MD Signature Date/Time: 12/29/2019/10:23:45 AM    Final    CT HEAD CODE STROKE WO CONTRAST  Result Date: 12/28/2019 CLINICAL DATA:  Code stroke. Dizziness, headache, right-sided weakness. EXAM: CT HEAD WITHOUT CONTRAST TECHNIQUE: Contiguous axial images were obtained from the base of the skull through the vertex without intravenous contrast. COMPARISON:  None. FINDINGS: Brain: No acute hemorrhage. Query linear area of hypoattenuation in the right cerebellum (series 3, image 7), possibly acute or subacute infarct. No hydrocephalus. No mass lesion.  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 Vascular: Mild apparent hyperdensity of the basilar artery, which may relate to streak artifact, but warrants follow-up on forthcoming CTA. Calcific atherosclerosis. Skull: No acute fracture. Sinuses/Orbits: Right maxillary sinus retention cyst. Remote right  medial orbital wall fracture. Other: No mastoid effusions. ASPECTS [facility] Stroke Program Early CT Score) Total score (0-10 with 10 being normal): 10 IMPRESSION: 1. Possible acute/subacute right cerebellar infarct. MRI could further evaluate if clinically indicated. 2. Mild apparent hyperdensity of the basilar artery, which may relate to streak artifact, but warrants follow-up on forthcoming CTA. 3. Otherwise, no evidence of acute large vascular territory infarct or acute hemorrhage. 4. ASPECTS is 10 Code stroke imaging results were communicated on 12/28/2019 at 11:55 am to provider Dr. Thomasena Edis via telephone, who verbally acknowledged these results. Electronically Signed   By: Feliberto Harts MD   On: 12/28/2019 11:59   CT ANGIO HEAD CODE STROKE  Addendum Date: 12/28/2019   ADDENDUM REPORT: 12/28/2019 12:29 ADDENDUM: CORRECTION. In the findings it mentions infarct in the posterior circulation in the perfusion section. This was a typo. There is no core infarct on perfusion, but there is a large area of penumbra. This was clarified with Dr. Thomasena Edis at 12:28 PM via telephone. Electronically Signed   By: Feliberto Harts MD   On: 12/28/2019 12:29   Result Date: 12/28/2019 CLINICAL DATA:  Right-sided weakness, blurry vision, vomiting, headaches. EXAM: CT ANGIOGRAPHY HEAD AND NECK CT PERFUSION BRAIN TECHNIQUE: Multidetector CT imaging of the head and neck was performed using the standard protocol during bolus administration of intravenous contrast. Multiplanar CT image reconstructions and MIPs were obtained to evaluate the vascular anatomy. Carotid stenosis measurements (when applicable) are obtained utilizing NASCET criteria, using the distal internal carotid diameter as the denominator. Multiphase CT imaging of the brain was performed following IV bolus contrast injection. Subsequent parametric perfusion maps were calculated using RAPID software. CONTRAST:  OMNIPAQUE IOHEXOL 350 MG/ML SOLN COMPARISON:   None. FINDINGS: CTA NECK FINDINGS Aortic arch: Imaged portion shows no evidence of aneurysm or dissection. No significant stenosis of the major arch vessel origins. Right carotid system: Mild narrowing of the proximal right ICA, likely related to atherosclerosis. No evidence of greater than 50% narrowing or occlusion. Left carotid system: No evidence of greater than 50% stenosis or occlusion. Vertebral arteries: Left dominant. No evidence of greater than 50% stenosis or occlusion. Possible tiny filling defect within the left V4 vertebral artery at the level of the PICA origin (series 7, image 146). The right vertebral artery is diminutive throughout its course. Skeleton: Mild multilevel degenerative changes of the cervical spine. Other neck: No mass or adenopathy. Unremarkable appearance of the thyroid gland. Upper chest: Paraseptal and centrilobular emphysema. No consolidation. Review of the MIP images confirms the above findings CTA HEAD FINDINGS Anterior circulation: No evidence of greater than 50% stenosis or occlusion. Bilateral M1 and proximal M2 MCA branches appear widely patent. Diminutive right A1 ACA, likely congenital variation given large left A1 ACA. Bilateral A2 ACA is are well opacified. No evidence of aneurysm. Posterior circulation: There is focal filling defect within the proximal to mid severe narrowing of the artery. Basilar artery, compatible with intraluminal thrombus which results in severe narrowing. Thrombus does not appear to extend to the superior cerebellar or posterior cerebral origins. No evidence of greater than 50% narrowing of the posterior cerebral arteries. No evidence of aneurysm. Venous sinuses: Limited visualization given contrast bolus timing. Review of the MIP  images confirms the above findings CT Brain Perfusion Findings: CBF (<30%) Volume: 0 mLmL Perfusion (Tmax>6.0s) volume: Mismatch Volume: Infarction Location:Posterior circulation, including cerebellum and  occipital lobes. IMPRESSION: 1. 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 Possible small amount of thrombus in the left V4 vertebral artery. 2. Perfusion imaging demonstrates a large area of penumbra involving the posterior circulation without core infarct. Code stroke imaging results were communicated on 12/28/2019 at 12:01 p.m. to provider Dr. Thomasena Edis via telephone, who verbally acknowledged these results. Electronically Signed: By: Feliberto Harts MD On: 12/28/2019 12:17   CT ANGIO NECK CODE STROKE  Result Date: 12/28/2019 CLINICAL DATA:  Right-sided weakness, blurred vision, vomiting, headaches. EXAM: CT ANGIOGRAPHY HEAD AND NECK TECHNIQUE: Multidetector CT imaging of the head and neck was performed using the standard protocol during bolus administration of intravenous contrast. Multiplanar CT image reconstructions and MIPs were obtained to evaluate the vascular anatomy. Carotid stenosis measurements (when applicable) are obtained utilizing NASCET criteria, using the distal internal carotid diameter as the denominator. CONTRAST:  OMNIPAQUE IOHEXOL 350 MG/ML SOLN COMPARISON:  None. FINDINGS: CTA NECK FINDINGS Aortic arch: Imaged portion shows no evidence of aneurysm or dissection. No significant stenosis of the major arch vessel origins. Right carotid system: Mild narrowing of the proximal right ICA, likely related to atherosclerosis. No evidence of greater than 50% narrowing or occlusion. Left carotid system: No evidence of greater than 50% stenosis or occlusion. Vertebral arteries: Left dominant. No evidence of greater than 50% stenosis or occlusion. Possible tiny filling defect within the left V4 vertebral artery at the level of the PICA origin (series 7, image 146). The right vertebral artery is diminutive throughout its course. Skeleton: Mild multilevel degenerative changes of the cervical spine. Other neck: No mass or  adenopathy. Unremarkable appearance of the thyroid gland. Upper chest: Paraseptal and centrilobular emphysema. No consolidation. Review of the MIP images confirms the above findings CTA HEAD FINDINGS Anterior circulation: No evidence of greater than 50% stenosis or occlusion. Bilateral M1 and proximal M2 MCA branches appear widely patent. Diminutive right A1 ACA, likely congenital variation given large left A1 ACA. Bilateral A2 ACA is are well opacified. No evidence of aneurysm. Posterior circulation: There is focal filling defect within the proximal to mid severe narrowing of the artery. Basilar artery, compatible with intraluminal thrombus which results in severe narrowing. Thrombus does not appear to extend to the superior cerebellar or posterior cerebral origins. No evidence of greater than 50% narrowing of the posterior cerebral arteries. No evidence of aneurysm. Venous sinuses: Limited visualization given contrast bolus timing. Review of the MIP images confirms the above findings CT Brain Perfusion Findings: CBF (<30%) Volume: 0 mLmL Perfusion (Tmax>6.0s) volume: Mismatch Volume: Penumbra: Posterior circulation, including cerebellum and occipital lobes. IMPRESSION: 1. 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 Possible small amount of thrombus in the left V4 vertebral artery. 2. Perfusion imaging demonstrates a large area of penumbra involving the posterior circulation without core infarct. Code stroke imaging results were communicated on 12/28/2019 at 12: 01 p.m. to provider Dr. Thomasena Edis via telephone, who verbally acknowledged these results. Electronically Signed By: Feliberto Harts MD On: 12/28/2019 12:17 Electronically Signed   By: Feliberto Harts MD   On: 12/28/2019 12:27    Labs:  CBC: Recent Labs    12/28/19 1141 12/28/19 1143 12/29/19 0500  WBC 15.7*  --  19.1*  HGB 18.1* 19.7* 15.5  HCT 56.0*  58.0* 46.8  PLT 338  --  297    COAGS: Recent Labs    12/28/19 1141  INR 1.0  APTT 29    BMP: Recent Labs    12/28/19 1141 12/28/19 1143 12/29/19 0500  NA 138 139 136  K 3.9 3.9 3.9  CL 101 101 103  CO2 22  --  22  GLUCOSE 145* 143* 138*  BUN 7 8 9   CALCIUM 9.9  --  8.9  CREATININE 1.33* 1.20 0.96  GFRNONAA >60  --  >60  GFRAA >60  --  >60    LIVER FUNCTION TESTS: Recent Labs    12/28/19 1141  BILITOT 1.6*  AST 25  ALT 29  ALKPHOS 95  PROT 7.8  ALBUMIN 4.4    Assessment and Plan:  49 y/o M who presented as a code stroke yesterday s/p left basilar artery thrombectomy in NIR achieving TICI 3 revascularization seen today for post procedure site check.  Patient extubated, A&O x 4, tolerating PO intake. Right CFA puncture site soft, mildly tender to palpation, non pulsatile.   Continue routine wound care of puncture site until healed. Further plans per primary team/neurology - NIR remains available as needed.  Electronically Signed: 52, PA-C 12/29/2019, 12:29 PM   I spent a total of 15 Minutes at the the patient's bedside AND on the patient's hospital floor or unit, greater than 50% of which was counseling/coordinating care for left basilar artery thrombectomy follow up.

## 2019-12-30 ENCOUNTER — Encounter (HOSPITAL_COMMUNITY): Payer: Self-pay | Admitting: Neurology

## 2019-12-30 ENCOUNTER — Other Ambulatory Visit: Payer: Self-pay | Admitting: Physician Assistant

## 2019-12-30 DIAGNOSIS — I6322 Cerebral infarction due to unspecified occlusion or stenosis of basilar arteries: Principal | ICD-10-CM

## 2019-12-30 DIAGNOSIS — F17200 Nicotine dependence, unspecified, uncomplicated: Secondary | ICD-10-CM

## 2019-12-30 DIAGNOSIS — I236 Thrombosis of atrium, auricular appendage, and ventricle as current complications following acute myocardial infarction: Secondary | ICD-10-CM

## 2019-12-30 DIAGNOSIS — I161 Hypertensive emergency: Secondary | ICD-10-CM

## 2019-12-30 DIAGNOSIS — I639 Cerebral infarction, unspecified: Secondary | ICD-10-CM

## 2019-12-30 LAB — MPO/PR-3 (ANCA) ANTIBODIES
ANCA Proteinase 3: <3.5 U/mL (ref 0.0–3.5)
Myeloperoxidase Abs: <9 U/mL (ref 0.0–9.0)

## 2019-12-30 LAB — CBC
HCT: 47.2 % (ref 39.0–52.0)
Hemoglobin: 15 g/dL (ref 13.0–17.0)
MCH: 28 pg (ref 26.0–34.0)
MCHC: 31.8 g/dL (ref 30.0–36.0)
MCV: 88.2 fL (ref 80.0–100.0)
Platelets: 216 10*3/uL (ref 150–400)
RBC: 5.35 MIL/uL (ref 4.22–5.81)
RDW: 14.9 % (ref 11.5–15.5)
WBC: 9 10*3/uL (ref 4.0–10.5)
nRBC: 0 % (ref 0.0–0.2)

## 2019-12-30 LAB — BASIC METABOLIC PANEL
Anion gap: 10 (ref 5–15)
BUN: 10 mg/dL (ref 6–20)
CO2: 24 mmol/L (ref 22–32)
Calcium: 9.2 mg/dL (ref 8.9–10.3)
Chloride: 101 mmol/L (ref 98–111)
Creatinine, Ser: 0.97 mg/dL (ref 0.61–1.24)
GFR calc Af Amer: >60 mL/min (ref >60)
GFR calc non Af Amer: >60 mL/min (ref >60)
Glucose, Bld: 96 mg/dL (ref 70–99)
Potassium: 3.9 mmol/L (ref 3.5–5.1)
Sodium: 135 mmol/L (ref 135–145)

## 2019-12-30 LAB — RPR: RPR Ser Ql: NONREACTIVE

## 2019-12-30 LAB — HEPARIN LEVEL (UNFRACTIONATED)
Heparin Unfractionated: 0.33 IU/mL (ref 0.30–0.70)
Heparin Unfractionated: 0.13 IU/mL — ABNORMAL LOW (ref 0.30–0.70)

## 2019-12-30 LAB — ANTINUCLEAR ANTIBODIES, IFA: ANA Ab, IFA: NEGATIVE

## 2019-12-30 LAB — ANCA TITERS
Atypical P-ANCA titer: <1:20 {titer}
C-ANCA: <1:20 {titer}
P-ANCA: <1:20 {titer}

## 2019-12-30 LAB — LIPID PANEL
Cholesterol: 163 mg/dL (ref 0–200)
HDL: 30 mg/dL — ABNORMAL LOW (ref >40)
LDL Cholesterol: 102 mg/dL — ABNORMAL HIGH (ref 0–99)
Total CHOL/HDL Ratio: 5.4 RATIO
Triglycerides: 153 mg/dL — ABNORMAL HIGH (ref <150)
VLDL: 31 mg/dL (ref 0–40)

## 2019-12-30 LAB — PLATELET INHIBITION P2Y12: Platelet Function  P2Y12: 60 [PRU] — ABNORMAL LOW (ref 182–335)

## 2019-12-30 LAB — HOMOCYSTEINE: Homocysteine: 13.4 umol/L (ref 0.0–14.5)

## 2019-12-30 MED ORDER — WARFARIN SODIUM 5 MG PO TABS
5.0000 mg | ORAL_TABLET | ORAL | Status: AC
  Administered 2019-12-30: 5 mg via ORAL
  Filled 2019-12-30: qty 1

## 2019-12-30 MED ORDER — ATORVASTATIN CALCIUM 40 MG PO TABS
40.0000 mg | ORAL_TABLET | Freq: Every day | ORAL | Status: DC
  Administered 2019-12-30 – 2020-01-01 (×3): 40 mg via ORAL
  Filled 2019-12-30 (×3): qty 1

## 2019-12-30 MED ORDER — LABETALOL HCL 5 MG/ML IV SOLN: 10.0000 mg | INTRAVENOUS | Status: DC | PRN

## 2019-12-30 MED ORDER — WARFARIN - PHARMACIST DOSING INPATIENT: Freq: Every day | Status: DC

## 2019-12-30 NOTE — Progress Notes (Signed)
Physical Therapy Treatment Patient Details Name: [name] MRN: [id number] DOB: [birth year] Today's Date: 12/30/2019    History of Present Illness Pt is a 49 y.o. male who presented to the ED with nausea, dizziness, speech difficulty, right-sided weakness and facial asymmetry. He has a history of multiple venous occlusions and was started on Eliquis but stopped taking it on Wednesday, 9/29. CT revealed posterior infarcts with BA thrombus. Pt s/p thrombectomy and vascular stent from the dominant distal left vertebral through the proximal basilar. Repeat CT following IR procedures showed new small bilateral occipital pole cortical infarcts suspected but stable CT appearance of mild right PICA infarct, and no associated hemorrhage or mass effect.    PT Comments    Pt progressing well towards all goals. Pt's c/o is his blurred vision, dizziness, and onset of numbness in R LE with ambulation. Pt requiring RW at this time for safe ambulation as pt with weakness and staggering to the R side. Pt with impaired balance in standing requiring assist for ADLs as well. 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   Follow Up Recommendations  CIR     Equipment Recommendations  Other (comment) (TBD at next venue)    Recommendations for Other Services Rehab consult     Precautions / Restrictions Precautions Precautions: Fall Precaution Comments: dizziness and impaired vision Restrictions Weight Bearing Restrictions: No    Mobility  Bed Mobility Overal bed mobility: Needs Assistance Bed Mobility: Supine to Sit     Supine to sit: Min guard;HOB elevated     General bed mobility comments: pt up in chair upon PT arrival  Transfers Overall transfer level: Needs assistance Equipment used: None;1 person hand held assist Transfers: Sit to/from Stand Sit to Stand: Min guard          General transfer comment: pt with good hand palcement on arm rests, slow and steady  Ambulation/Gait Ambulation/Gait assistance: Min guard;Min assist Gait Distance (Feet): 300 Feet (150 with RW, 150 without RW) Assistive device: Rolling walker (2 wheeled);1 person hand held assist Gait Pattern/deviations: Step-through pattern;Decreased stride length;Decreased stance time - right;Decreased weight shift to left;Staggering right Gait velocity: slow Gait velocity interpretation: <1.31 ft/sec, indicative of household ambulator General Gait Details: pt with noted R lateral lean and instability especially during turns, pt more steady with RW than with R HHA, pt agrees   Social research officer, government Rankin (Stroke Patients Only) Modified Rankin (Stroke Patients Only) Pre-Morbid Rankin Score: No symptoms Modified Rankin: Moderately severe disability     Balance Overall balance assessment: Needs assistance Sitting-balance support: Feet supported Sitting balance-Leahy Scale: Good     Standing balance support: Single extremity supported;During functional activity Standing balance-Leahy Scale: Poor Standing balance comment: reliant on external assist                             Cognition Arousal/Alertness: Awake/alert Behavior During Therapy: WFL for tasks assessed/performed Overall Cognitive Status: Impaired/Different from baseline Area of Impairment: Problem solving;Awareness                           Awareness: Emergent Problem Solving: Slow processing General Comments: pt able to follow commands and was able to identify his altered gait  pattern      Exercises General Exercises - Lower Extremity Heel Slides: AROM;Right;10 reps;Seated Hip Flexion/Marching: Left;10 reps;Standing (worked on bringing L leg up and balancing on R) Mini-Sqauts: AROM;Both;20 reps;Standing (with assist to inc WBing on R side)    General  Comments General comments (skin integrity, edema, etc.): VSS      Pertinent Vitals/Pain Pain Assessment: No/denies pain Faces Pain Scale: No hurt    Home Living Family/patient expects to be discharged to:: Private residence Living Arrangements: Children (son) Available Help at Discharge: Family;Available 24 hours/day Type of Home: House Home Access: Stairs to enter   Home Layout: One level Home Equipment: None      Prior Function Level of Independence: Independent      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[employer], Marketing executive   PT Goals (current goals can now be found in the care plan section) Acute Rehab PT Goals Patient Stated Goal: independence Progress towards PT goals: Progressing toward goals    Frequency    Min 4X/week      PT Plan Current plan remains appropriate    Co-evaluation              AM-PAC PT "6 Clicks" Mobility   Outcome Measure  Help needed turning from your back to your side while in a flat bed without using bedrails?: A Little Help needed moving from lying on your back to sitting on the side of a flat bed without using bedrails?: A Little Help needed moving to and from a bed to a chair (including a wheelchair)?: A Little Help needed standing up from a chair using your arms (e.g., wheelchair or bedside chair)?: A Little Help needed to walk in hospital room?: A Little Help needed climbing 3-5 steps with a railing? : A Lot 6 Click Score: 17    End of Session Equipment Utilized During Treatment: Gait belt Activity Tolerance: Patient tolerated treatment well Patient left: in bed;with call bell/phone within reach;with bed alarm set;with family/visitor present Nurse Communication: Mobility status PT Visit Diagnosis: Unsteadiness on feet (R26.81);Other abnormalities of gait and mobility (R26.89)     Time: 3785-8850 PT Time Calculation (min) (ACUTE ONLY): 28 min  Charges:  $Gait Training: 8-22 mins $Therapeutic Exercise: 8-22 mins                      Lewis Shock, PT, DPT Acute Rehabilitation Services Pager #: [phone] Office #: [phone]    Iona Hansen 12/30/2019, 2:14 PM
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
## 2019-12-30 NOTE — Progress Notes (Signed)
Inpatient Rehabilitation Admissions Coordinator  I will follow pt's progress with therapy over next 24 hrs to assist with planning dispo options pending his functional progress.  Ottie Glazier, RN, MSN Rehab Admissions Coordinator [phone] 12/30/2019 1:43 PM
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
## 2019-12-30 NOTE — Consult Note (Signed)
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 regurgitation is not visualized. No evidence of pulmonic stenosis. Aorta: The aortic root is normal in size and structure. Pulmonary Artery: Indeterminant PASP, inadequate TR jet. IAS/Shunts: No atrial level shunt detected by color flow Doppler.  LEFT VENTRICLE PLAX 2D LVIDd:         5.10 cm      Diastology LVIDs:         3.40 cm      LV e' medial:    4.35 cm/s LV PW:         1.50 cm  LV E/e' medial:  17.8 LV IVS:        1.50 cm      LV e' lateral:   4.46 cm/s LVOT diam:     2.50 cm      LV E/e' lateral: 17.4 LV SV:         107 LV SV Index:   44 LVOT Area:     4.91 cm  LV Volumes (MOD) LV vol d, MOD A2C: 146.0 ml LV vol d, MOD A4C: 58.0 ml LV vol s, MOD A2C: 30.6 ml LV vol s, MOD A4C: 23.7 ml LV SV MOD A2C:     115.4 ml LV SV MOD A4C:     58.0 ml LV SV MOD BP:      65.5 ml RIGHT VENTRICLE RV Basal diam:  3.90 cm LEFT ATRIUM              Index       RIGHT ATRIUM           Index LA diam:        4.70 cm  1.93 cm/m  RA Area:     26.80 cm LA Vol (A2C):   120.0 ml 49.26 ml/m RA Volume:   85.80 ml  35.22 ml/m LA Vol (A4C):   88.2 ml  36.21 ml/m LA  Biplane Vol: 108.0 ml 44.33 ml/m  AORTIC VALVE AV Area (Vmax):    3.81 cm AV Area (Vmean):   3.69 cm AV Area (VTI):     3.87 cm AV Vmax:           156.10 cm/s AV Vmean:          105.627 cm/s AV VTI:            0.275 m AV Peak Grad:      9.7 mmHg AV Mean Grad:      5.1 mmHg LVOT Vmax:         121.27 cm/s LVOT Vmean:        79.349 cm/s LVOT VTI:          0.217 m LVOT/AV VTI ratio: 0.79  AORTA Ao Root diam: 3.60 cm Ao Asc diam:  3.60 cm MITRAL VALVE MV Area (PHT): 4.06 cm     SHUNTS MV Decel Time: 187 msec     Systemic VTI:  0.22 m MV E velocity: 77.60 cm/s   Systemic Diam: 2.50 cm MV A velocity: 109.00 cm/s MV E/A ratio:  0.71 Dina Rich MD Electronically signed by Dina Rich MD Signature Date/Time: 12/29/2019/10:23:45 AM    Final    CT HEAD CODE STROKE WO CONTRAST  Result Date: 12/28/2019 CLINICAL DATA:  Code stroke. Dizziness, headache, right-sided weakness. EXAM: CT HEAD WITHOUT CONTRAST TECHNIQUE: Contiguous axial images were obtained from the base of the skull through the vertex without intravenous contrast. COMPARISON:  None. FINDINGS: Brain: No acute hemorrhage. Query linear area of hypoattenuation in the right cerebellum (series 3, image 7), possibly acute or subacute infarct. No hydrocephalus. No mass lesion. 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 Vascular: Mild apparent hyperdensity of the basilar artery, which may relate to streak artifact, but warrants follow-up on forthcoming CTA. Calcific atherosclerosis. Skull: No acute fracture. Sinuses/Orbits: Right maxillary sinus retention cyst. Remote right medial orbital wall fracture. Other: No mastoid effusions. ASPECTS [facility] Stroke Program Early CT Score) Total score (0-10 with 10 being normal): 10 IMPRESSION: 1. Possible acute/subacute right cerebellar infarct. MRI could further evaluate if clinically indicated. 2.  Mild apparent hyperdensity of the basilar artery, which may relate to streak  artifact, but warrants follow-up on forthcoming CTA. 3. Otherwise, no evidence of acute large vascular territory infarct or acute hemorrhage. 4. ASPECTS is 10 Code stroke imaging results were communicated on 12/28/2019 at 11:55 am to provider Dr. Thomasena Edis via telephone, who verbally acknowledged these results. Electronically Signed   By: Feliberto Harts MD   On: 12/28/2019 11:59   CT ANGIO HEAD CODE STROKE  Addendum Date: 12/28/2019   ADDENDUM REPORT: 12/28/2019 12:29 ADDENDUM: CORRECTION. In the findings it mentions infarct in the posterior circulation in the perfusion section. This was a typo. There is no core infarct on perfusion, but there is a large area of penumbra. This was clarified with Dr. Thomasena Edis at 12:28 PM via telephone. Electronically Signed   By: Feliberto Harts MD   On: 12/28/2019 12:29   Result Date: 12/28/2019 CLINICAL DATA:  Right-sided weakness, blurry vision, vomiting, headaches. EXAM: CT ANGIOGRAPHY HEAD AND NECK CT PERFUSION BRAIN TECHNIQUE: Multidetector CT imaging of the head and neck was performed using the standard protocol during bolus administration of intravenous contrast. Multiplanar CT image reconstructions and MIPs were obtained to evaluate the vascular anatomy. Carotid stenosis measurements (when applicable) are obtained utilizing NASCET criteria, using the distal internal carotid diameter as the denominator. Multiphase CT imaging of the brain was performed following IV bolus contrast injection. Subsequent parametric perfusion maps were calculated using RAPID software. CONTRAST:  OMNIPAQUE IOHEXOL 350 MG/ML SOLN COMPARISON:  None. FINDINGS: CTA NECK FINDINGS Aortic arch: Imaged portion shows no evidence of aneurysm or dissection. No significant stenosis of the major arch vessel origins. Right carotid system: Mild narrowing of the proximal right ICA, likely related to atherosclerosis. No evidence of greater than 50% narrowing or occlusion. Left carotid system: No  evidence of greater than 50% stenosis or occlusion. Vertebral arteries: Left dominant. No evidence of greater than 50% stenosis or occlusion. Possible tiny filling defect within the left V4 vertebral artery at the level of the PICA origin (series 7, image 146). The right vertebral artery is diminutive throughout its course. Skeleton: Mild multilevel degenerative changes of the cervical spine. Other neck: No mass or adenopathy. Unremarkable appearance of the thyroid gland. Upper chest: Paraseptal and centrilobular emphysema. No consolidation. Review of the MIP images confirms the above findings CTA HEAD FINDINGS Anterior circulation: No evidence of greater than 50% stenosis or occlusion. Bilateral M1 and proximal M2 MCA branches appear widely patent. Diminutive right A1 ACA, likely congenital variation given large left A1 ACA. Bilateral A2 ACA is are well opacified. No evidence of aneurysm. Posterior circulation: There is focal filling defect within the proximal to mid severe narrowing of the artery. Basilar artery, compatible with intraluminal thrombus which results in severe narrowing. Thrombus does not appear to extend to the superior cerebellar or posterior cerebral origins. No evidence of greater than 50% narrowing of the posterior cerebral arteries. No evidence of aneurysm. Venous sinuses: Limited visualization given contrast bolus timing. Review of the MIP images confirms the above findings CT Brain Perfusion Findings: CBF (<30%) Volume: 0 mLmL Perfusion (Tmax>6.0s) volume: Mismatch Volume: Infarction Location:Posterior circulation, including cerebellum and occipital lobes. IMPRESSION: 1. 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 Possible small amount of thrombus in the left V4 vertebral artery. 2. Perfusion imaging demonstrates a large area of penumbra involving the posterior circulation without core infarct. Code  stroke imaging results were communicated on 12/28/2019  at 12:01 p.m. to provider Dr. Thomasena Edis via telephone, who verbally acknowledged these results. Electronically Signed: By: Feliberto Harts MD On: 12/28/2019 12:17   CT ANGIO NECK CODE STROKE  Result Date: 12/28/2019 CLINICAL DATA:  Right-sided weakness, blurred vision, vomiting, headaches. EXAM: CT ANGIOGRAPHY HEAD AND NECK TECHNIQUE: Multidetector CT imaging of the head and neck was performed using the standard protocol during bolus administration of intravenous contrast. Multiplanar CT image reconstructions and MIPs were obtained to evaluate the vascular anatomy. Carotid stenosis measurements (when applicable) are obtained utilizing NASCET criteria, using the distal internal carotid diameter as the denominator. CONTRAST:  OMNIPAQUE IOHEXOL 350 MG/ML SOLN COMPARISON:  None. FINDINGS: CTA NECK FINDINGS Aortic arch: Imaged portion shows no evidence of aneurysm or dissection. No significant stenosis of the major arch vessel origins. Right carotid system: Mild narrowing of the proximal right ICA, likely related to atherosclerosis. No evidence of greater than 50% narrowing or occlusion. Left carotid system: No evidence of greater than 50% stenosis or occlusion. Vertebral arteries: Left dominant. No evidence of greater than 50% stenosis or occlusion. Possible tiny filling defect within the left V4 vertebral artery at the level of the PICA origin (series 7, image 146). The right vertebral artery is diminutive throughout its course. Skeleton: Mild multilevel degenerative changes of the cervical spine. Other neck: No mass or adenopathy. Unremarkable appearance of the thyroid gland. Upper chest: Paraseptal and centrilobular emphysema. No consolidation. Review of the MIP images confirms the above findings CTA HEAD FINDINGS Anterior circulation: No evidence of greater than 50% stenosis or occlusion. Bilateral M1 and proximal M2 MCA branches appear widely patent.  Diminutive right A1 ACA, likely congenital variation given large left A1 ACA. Bilateral A2 ACA is are well opacified. No evidence of aneurysm. Posterior circulation: There is focal filling defect within the proximal to mid severe narrowing of the artery. Basilar artery, compatible with intraluminal thrombus which results in severe narrowing. Thrombus does not appear to extend to the superior cerebellar or posterior cerebral origins. No evidence of greater than 50% narrowing of the posterior cerebral arteries. No evidence of aneurysm. Venous sinuses: Limited visualization given contrast bolus timing. Review of the MIP images confirms the above findings CT Brain Perfusion Findings: CBF (<30%) Volume: 0 mLmL Perfusion (Tmax>6.0s) volume: Mismatch Volume: Penumbra: Posterior circulation, including cerebellum and occipital lobes. IMPRESSION: 1. 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 Possible small amount of thrombus in the left V4 vertebral artery. 2. Perfusion imaging demonstrates a large area of penumbra involving the posterior circulation without core infarct. Code stroke imaging results were communicated on 12/28/2019 at 12: 01 p.m. to provider Dr. Thomasena Edis via telephone, who verbally acknowledged these results. Electronically Signed By: Feliberto Harts MD On: 12/28/2019 12:17 Electronically Signed   By: Feliberto Harts MD   On: 12/28/2019 12:27   Outside Abd MRI 09/2019 IMPRESSION: 1. Redemonstration of thrombosis involving the splenic artery, with interval development of thrombus involving the portal vein, splenic vein, and proximal SMV as detailed above. 2. The lesion in question in the proximal pancreatic body is felt to represent a small vessel containing thrombus, likely related to the splenic artery. 3. Small area of infarction within the inferior spleen.  Outside Abd CT 08/2019 1. Small incisional hernia  containing a small portion of the nonobstructed sigmoid colon 2. Additional fat-containing supraumbilical hernia. 3. Cholelithiasis without evidence of cholecystitis. 4. Indeterminate low-attenuation lesion within the pancreatic body. Recommend a MRI/MRCP  for further evaluation. Additionally there is thrombosis of the splenic artery possibly related to a dissection.   Electronically Signed by: Aura Fey, MD   Assessment and Plan:   1. Acute stroke, also with recent history of abdominal vessel clotting and LV thrombus - LVEF is normal except shows apical akinesis with LV thrombus - he is now s/p basilar artery stenting and is on ASA + Brilinta + IV heparin - will discuss next steps with MD - etiology of apical akinesis not clear at this time, no prior known MI although EKG possibly suggestive of prior silent infarct therefore suspect he'll need an ischemic evaluation once recovered from stroke  2. HTN  - neuro managing, on losartan, HCTZ, nifedipine  3. HLD goal LDL <70 given stroke  - now on atorvastatin - will need OP lab f/u  4. Tobacco/habitual ETOH use - counseled on importance of cessation     For questions or updates, please contact [facility] Please consult [email or web address] for contact info under    Signed, Laurann Montana, PA-C  12/30/2019 3:49 PM
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
## 2019-12-30 NOTE — Consult Note (Signed)
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 but there is a large area of penumbra. This was clarified with Dr. Thomasena Edis at 12:28 PM via telephone. Electronically Signed   By: Feliberto Harts MD   On: 12/28/2019 12:29   Result Date: 12/28/2019 CLINICAL DATA:  Right-sided weakness, blurry vision, vomiting, headaches. EXAM: CT ANGIOGRAPHY HEAD AND NECK CT PERFUSION BRAIN TECHNIQUE: Multidetector CT imaging of the head and neck was performed using  the standard protocol during bolus administration of intravenous contrast. Multiplanar CT image reconstructions and MIPs were obtained to evaluate the vascular anatomy. Carotid stenosis measurements (when applicable) are obtained utilizing NASCET criteria, using the distal internal carotid diameter as the denominator. Multiphase CT imaging of the brain was performed following IV bolus contrast injection. Subsequent parametric perfusion maps were calculated using RAPID software. CONTRAST:  OMNIPAQUE IOHEXOL 350 MG/ML SOLN COMPARISON:  None. FINDINGS: CTA NECK FINDINGS Aortic arch: Imaged portion shows no evidence of aneurysm or dissection. No significant stenosis of the major arch vessel origins. Right carotid system: Mild narrowing of the proximal right ICA, likely related to  atherosclerosis. No evidence of greater than 50% narrowing or occlusion. Left carotid system: No evidence of greater than 50% stenosis or occlusion. Vertebral arteries: Left dominant. No evidence of greater than 50% stenosis or occlusion. Possible tiny filling defect within the left V4 vertebral artery at the level of the PICA origin (series 7, image 146). The right vertebral artery is diminutive throughout its course. Skeleton: Mild multilevel degenerative changes of the cervical spine. Other neck: No mass or adenopathy. Unremarkable appearance of the thyroid gland. Upper chest: Paraseptal and centrilobular emphysema. No consolidation. Review of the MIP images confirms the above findings CTA HEAD FINDINGS Anterior circulation: No evidence of greater than 50% stenosis or occlusion. Bilateral M1 and proximal M2 MCA branches appear widely patent. Diminutive right A1 ACA, likely congenital variation given large left A1 ACA. Bilateral A2 ACA is are well opacified. No evidence of aneurysm. Posterior circulation: There is focal filling defect within the proximal to mid severe narrowing of the artery. Basilar artery, compatible with intraluminal thrombus which results in severe narrowing. Thrombus does not appear to extend to the superior cerebellar or posterior cerebral origins. No evidence of greater than 50% narrowing of the posterior cerebral arteries. No evidence of aneurysm. Venous sinuses: Limited visualization given contrast bolus timing. Review of the MIP images confirms the above findings CT Brain Perfusion Findings: CBF (<30%) Volume: 0 mLmL Perfusion (Tmax>6.0s) volume: Mismatch Volume: Infarction Location:Posterior circulation, including cerebellum and occipital lobes. IMPRESSION: 1. 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 Possible small amount of thrombus in the left V4 vertebral artery. 2. Perfusion imaging  demonstrates a large area of penumbra involving the posterior circulation without core infarct. Code stroke imaging results were communicated on 12/28/2019 at 12:01 p.m. to provider Dr. Thomasena Edis via telephone, who verbally acknowledged these results. Electronically Signed: By: Feliberto Harts MD On: 12/28/2019 12:17   CT ANGIO NECK CODE STROKE  Result Date: 12/28/2019 CLINICAL DATA:  Right-sided weakness, blurred vision, vomiting, headaches. EXAM: CT ANGIOGRAPHY HEAD AND NECK TECHNIQUE: Multidetector CT imaging of the head and neck was performed using the standard protocol during bolus administration of intravenous contrast. Multiplanar CT image reconstructions and MIPs were obtained to evaluate the vascular anatomy. Carotid stenosis measurements (when applicable) are obtained utilizing NASCET criteria, using the distal internal carotid diameter as the denominator. CONTRAST:  OMNIPAQUE IOHEXOL 350 MG/ML SOLN COMPARISON:  None. FINDINGS: CTA NECK FINDINGS Aortic arch: Imaged portion shows no evidence of aneurysm or dissection. No significant stenosis of the major arch vessel origins. Right carotid system: Mild narrowing of the proximal right ICA, likely related to atherosclerosis. No evidence of greater than 50% narrowing or occlusion. Left carotid system: No evidence of greater than 50% stenosis or occlusion. Vertebral arteries: Left dominant. No evidence of greater than 50% stenosis or  occlusion. Possible tiny filling defect within the left V4 vertebral artery at the level of the PICA origin (series 7, image 146). The right vertebral artery is diminutive throughout its course. Skeleton: Mild multilevel degenerative changes of the cervical spine. Other neck: No mass or adenopathy. Unremarkable appearance of the thyroid gland. Upper chest: Paraseptal and centrilobular emphysema. No consolidation. Review of the MIP images confirms the above findings CTA HEAD FINDINGS Anterior circulation: No evidence of  greater than 50% stenosis or occlusion. Bilateral M1 and proximal M2 MCA branches appear widely patent. Diminutive right A1 ACA, likely congenital variation given large left A1 ACA. Bilateral A2 ACA is are well opacified. No evidence of aneurysm. Posterior circulation: There is focal filling defect within the proximal to mid severe narrowing of the artery. Basilar artery, compatible with intraluminal thrombus which results in severe narrowing. Thrombus does not appear to extend to the superior cerebellar or posterior cerebral origins. No evidence of greater than 50% narrowing of the posterior cerebral arteries. No evidence of aneurysm. Venous sinuses: Limited visualization given contrast bolus timing. Review of the MIP images confirms the above findings CT Brain Perfusion Findings: CBF (<30%) Volume: 0 mLmL Perfusion (Tmax>6.0s) volume: Mismatch Volume: Penumbra: Posterior circulation, including cerebellum and occipital lobes. IMPRESSION: 1. 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 Possible small amount of thrombus in the left V4 vertebral artery. 2. Perfusion imaging demonstrates a large area of penumbra involving the posterior circulation without core infarct. Code stroke imaging results were communicated on 12/28/2019 at 12: 01 p.m. to provider Dr. Thomasena Edis via telephone, who verbally acknowledged these results. 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 Does the need for close, 24 hr/day medical supervision in concert with the patient's rehab needs make it unreasonable for this patient to be served in a less intensive setting?  Potentially 2. Co-Morbidities requiring supervision/potential complications: HTN, hx of pancreatic/splenic thrombus 3. Due to bladder management, bowel management, safety, skin/wound care, disease management, medication administration, pain management and patient education, does the patient require 24 hr/day rehab nursing? Potentially 4. Does the patient require coordinated care of a physician, rehab nurse, therapy disciplines of PT, OT to address physical and functional deficits in the context of the above medical diagnosis(es)? Potentially Addressing deficits in the following areas: balance, endurance, locomotion, strength, transferring, bowel/bladder control, bathing, dressing, feeding, grooming, toileting and psychosocial support 5. Can the patient actively participate in an intensive therapy program of at least 3 hrs of therapy per day at least 5 days per week? Yes 6. The potential for patient to make measurable gains while on inpatient rehab is good 7. Anticipated functional outcomes upon discharge from inpatient rehab are modified independent  with PT, modified independent with OT, n/a with SLP. 8. Estimated rehab length of stay to reach the above functional goals is: 5-7 days 9. Anticipated discharge destination: Home 10. Overall Rehab/Functional Prognosis: excellent  RECOMMENDATIONS: This patient's condition is appropriate for continued rehabilitative care in the following setting: inpatient rehab if he does not progress as I expect him to. Otherwise, given his social supports, he could potentially discharge to home with HH vs outpt PT/OT towards the end of this week.  Patient has agreed to participate in recommended  program. Yes Note that insurance prior authorization may be required for reimbursement for recommended care.  Comment: Rehab Admissions Coordinator to follow up.  Thanks,  Ranelle Oyster, MD, [place] [facility]  I have personally performed a face to face diagnostic evaluation of  this patient. Additionally, I have examined pertinent labs and radiographic images. I have reviewed and concur with the physician assistant's documentation above.    Jacquelynn Cree, PA-C 12/30/2019

## 2019-12-30 NOTE — Progress Notes (Addendum)
STROKE TEAM PROGRESS NOTE   INTERVAL HISTORY His son is at the bedside.  Pt is sitting up in bed for breakfast. Eating well. No complains. TTE showed LV thrombus, now on heparin IV. Will consult cardiology. Due to stent, will continue ASA and brilinta per Dr. Corliss Skains.    OBJECTIVE Vitals:   12/30/19 0700 12/30/19 0800 12/30/19 0900 12/30/19 1000  BP: (!) 144/84 (!) 150/95 (!) 148/79 (!) 138/93  Pulse: (!) 57 74  65  Resp: 10 (!) 25 (!) 9 17  Temp:  98.5 F (36.9 C)    TempSrc:  Oral    SpO2: 97% 96%  96%  Weight:      Height:        CBC:  Recent Labs  Lab 12/28/19 1141 12/28/19 1143 12/29/19 0500 12/30/19 0938  WBC 15.7*   < > 19.1* 9.0  NEUTROABS 12.0*  --  16.6*  --   HGB 18.1*   < > 15.5 15.0  HCT 56.0*   < > 46.8 47.2  MCV 87.1   < > 87.5 88.2  PLT 338   < > 297 216   < > = values in this interval not displayed.    Basic Metabolic Panel:  Recent Labs  Lab 12/29/19 0500 12/30/19 0938  NA 136 135  K 3.9 3.9  CL 103 101  CO2 22 24  GLUCOSE 138* 96  BUN 9 10  CREATININE 0.96 0.97  CALCIUM 8.9 9.2    Lipid Panel:     Component Value Date/Time   CHOL 163 12/30/2019 0938   TRIG 153 (H) 12/30/2019 0938   HDL 30 (L) 12/30/2019 0938   CHOLHDL 5.4 12/30/2019 0938   VLDL 31 12/30/2019 0938   LDLCALC 102 (H) 12/30/2019 0938   HgbA1c:  Lab Results  Component Value Date   HGBA1C 6.0 (H) 12/29/2019   Urine Drug Screen:     Component Value Date/Time   LABOPIA NONE DETECTED 12/29/2019 1610   COCAINSCRNUR NONE DETECTED 12/29/2019 1610   LABBENZ NONE DETECTED 12/29/2019 1610   AMPHETMU NONE DETECTED 12/29/2019 1610   THCU NONE DETECTED 12/29/2019 1610   LABBARB NONE DETECTED 12/29/2019 1610    Alcohol Level     Component Value Date/Time   ETH (H) 02/23/2007 0515    378        LOWEST DETECTABLE LIMIT FOR SERUM ALCOHOL IS 11 mg/dL FOR MEDICAL PURPOSES ONLY    IMAGING  CT HEAD WO CONTRAST 12/28/2019  IMPRESSION:  1. New small bilateral  Occipital pole cortical infarcts suspected. But stable CT appearance of mild Right PICA infarct since this morning, and no associated hemorrhage or mass effect.  2. And elsewhere stable CT appearance of the brain status post endovascular treatment of Basilar Artery thrombosis.  3. New vascular stent from the dominant distal left vertebral through the proximal basilar.   CT HEAD CODE STROKE WO CONTRAST 12/28/2019 IMPRESSION:  1. Possible acute/subacute right cerebellar infarct. MRI could further evaluate if clinically indicated.  2. Mild apparent hyperdensity of the basilar artery, which may relate to streak artifact, but warrants follow-up on forthcoming CTA.  3. Otherwise, no evidence of acute large vascular territory infarct or acute hemorrhage.   CT ANGIO HEAD CODE STROKE CT CEREBRAL PERFUSION W CONTRAST CT ANGIO NECK CODE STROKE 12/28/2019   ADDENDUM: CORRECTION. In the findings it mentions infarct in the posterior circulation in the perfusion section. This was a typo. There is no core infarct on perfusion, but there is a  large area of penumbra. This was clarified with Dr. Thomasena Edis  IMPRESSION:  1. 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 Possible small amount of thrombus in the left V4 vertebral artery.  2. Perfusion imaging demonstrates a large area of penumbra involving the posterior circulation without core infarct.   Neuro Interventional Radiology - Cerebral Angiogram with Intervention 12/28/19 3:53 PM S/P  Lt Common carotid and Lt Vertebral artrriograms . RT CFA approach. Findings. 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 S/P complete revascularization withx1 pass with 66mm x 37 mm embotrap retriever and aspiration followed by stent assisted angioplasty  Achieving a TICI 3 revascularization. Post CT brain .No ICH or mass effect. No hydrocephalus. hemostasis in  the rt groin with a 62F exoseal .  Groin soft. Distal pulses all dopplerable. Extubated. Obeying commands appropriately.Slurred speech. Moving all 4s with mild weakness imn the RT UE. Pupils 64mm Rt = LT. Absent horizontal gaze to the left.No Nystagmus.Says sees better in the Lt VF. Tongue midline. S.Deveshwar MD  Transthoracic Echocardiogram  1. 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 1 x 1.8 cm apical density/filling defect on  contrast imaging that is semi mobile consistent with apical thrombus. .  Left ventricular ejection fraction, by estimation, is 55 to 60%. The left  ventricle has normal function. The  left ventricle demonstrates regional wall motion abnormalities (see  scoring diagram/findings for description). There is severe left  ventricular hypertrophy. Left ventricular diastolic parameters are  consistent with Grade I diastolic dysfunction (impaired  relaxation). Elevated left atrial pressure. There is akinesis of the left  ventricular, apical segment.  2. Right ventricular systolic function is normal. The right ventricular  size is normal.  3. The mitral valve is normal in structure. No evidence of mitral valve  regurgitation. No evidence of mitral stenosis.  4. The aortic valve is tricuspid. Aortic valve regurgitation is not  visualized. No aortic stenosis is present.  5. Apical thrombus reported to Dr Roda Shutters through epic direct messaging.   ECG - SR rate 71 BPM. Possible previous infarcts (See cardiology reading for complete details)  PHYSICAL EXAM  Temp:  [98 F (36.7 C)-98.5 F (36.9 C)] 98.5 F (36.9 C) (10/04 0800) Pulse Rate:  [57-92] 65 (10/04 1000) Resp:  [9-25] 17 (10/04 1000) BP: (116-155)/(72-95) 138/93 (10/04 1000) SpO2:  [93 %-99 %] 96 % (10/04 1000) Weight:  [120.2 kg] 120.2 kg (10/03 1115)  General - Well nourished, well developed, in no apparent distress.  Ophthalmologic - fundi not visualized due to noncooperation.  Cardiovascular - Regular rhythm and  rate.  Neuro - awake alert, orientated x 4. No aphasia, no significant dysarthria, following all commands. Able to name and repeat. Visual field full, PERRL. However, left gaze palsy with nystagmus in both eyes. Right gaze right eye incomplete. Downward gaze rotational nystagmus. Facial symmetrical. Tongue midline. BUE and BLE equal strength, no ataxia. Sensation subjectively symmetrical. Gait not tested.    ASSESSMENT/PLAN [name] is a 49 y.o. male with history of multiple venous occlusions and was started on Eliquis but stopped taking it about 2 days ago. He presented to [facility] ED after he woke feeling dizzy and nauseated. He then noticed speech difficulty, right-sided weakness and facial asymmetry.  He did not receive IV t-PA due to late presentation (>4.5 hours from time of onset). 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  S/P complete revascularization withx1 pass with 37mm x 37 mm embotrap retriever and aspiration followed by stent assisted angioplasty.  Achieving a TICI 3 revascularization.  Stroke: posterior infarcts with BA thrombus s/p IR and stenting with TICI3, embolic, due to LV thrombus.  Resultant  Left gaze nystagmus  CT Head - Possible acute/subacute right cerebellar infarct    CTA H&N - intraluminal thrombus within the proximal to mid basilar artery with resulting severe narrowing. Possible small amount of thrombus in the left V4 vertebral artery.   CTP - positive penumbra at posterior circulation   IR - stent assisted angioplasty basilar artery with TICI3  CT head - New small bilateral Occipital pole cortical infarcts suspected. But stable CT appearance of mild Right PICA infarct since this morning, and no associated hemorrhage or mass effect.   MRI head - Numerous acute infarcts throughout the posterior circulation, including bilateral thalami, bilateral occipital lobes, the cerebellum, and the brainstem.  MRA head -  patent BA stent  2D Echo - EF 55-60% but LV thrombus with apex akinetic   Sars Corona Virus 2 - negative  LDL - 102  HgbA1c - 6.0  UDS - neg  VTE prophylaxis - heparin IV  Had been on Eliquis but stopped taking it 2 days ago)) prior to admission, now on aspirin 81 mg daily and Brilinta (ticagrelor) 90 mg bid as well as heparin IV per stroke protocol  Patient will counseled to be compliant with his antithrombotic medications  Ongoing aggressive stroke risk factor management  Therapy recommendations:  pending  Disposition:  Pending  LV thrombus  Per Dr. Allena Katz @ [facility], pt was found to have LV thrombus on CT chest back in 10/2019 and was sent to cardiology, was put on eliquis  This admission TTE showed LV thrombus with apex akinetic   CTA showed intraluminal thrombus within the proximal to mid basilar artery with resulting severe narrowing.  S/p IR with BA stenting and TICI3 revasc  On heparin IV per stroke protocol  Cardiology consulted  Systemic clotting disorder  08/2019 CT and 09/2019 MRI abdomen found to have splenic A, portal V, splenic V and proximal SMV thrombosis  Followed with Dr. Allena Katz in [place], put on eliquis 5 bid  Discussed with Dr. Carlyle Lipa @ [facility] - He stated that pt recent hypercoagulable lab all negative except homocysteine slightly elevated at 17.1   Missed eliquis for 2-3 days PTA  This admission LV thrombus with BA thrombus  On heparin IV  Continue to follow up with Dr. Allena Katz as outpt  Hypertension  Home BP meds: Losartan ; HCTZ ; Nifedipiine  Resume home meds  Cleviprex off now  Stable now . BP goal < 160 post IR  . Long-term BP goal normotensive  Hyperlipidemia  Home Lipid lowering medication: none   LDL 102, goal < 70  Now on lipitor 40   Continue statin at discharge  Tobacco abuse  Current smoker  Smoking cessation counseling provided  Pt is willing to quit  Other Stroke Risk  Factors  Obesity, Body mass index is 36.96 kg/m., recommend weight loss, diet and exercise as appropriate   Alcohol use - ETH 378, recommend no more than 2 drinks per day  Other Active Problems  Code status - Full code  Leukocytosis - WBC's - 15.7->19.1->9.0 (afebrile)  Hospital day # 2  This patient is critically ill due to LV thrombus on anticoagulation, BA thrombus s/p thrombectomy and stenting, systemic clotting disorder, hypertensive emergency and  at significant risk of neurological worsening, death form recurrent thrombus, heart failure, stent reocclusion, hypertensive encephalopathy, seizure. This patient's care requires constant monitoring of vital signs, hemodynamics, respiratory and cardiac monitoring, review of multiple databases, neurological assessment, discussion with family, other specialists and medical decision making of high complexity. I spent 35 minutes of neurocritical care time in the care of this patient.  Marvel Plan, MD PhD Stroke Neurology 12/30/2019 11:07 AM   To contact Stroke Continuity provider, please refer to [email or web address]. After hours, contact General Neurology

## 2019-12-30 NOTE — Progress Notes (Signed)
Referring Physician(s): Roda Shutters, Code stroke  Supervising Physician: Julieanne Cotton  Patient Status:  [facility] - In-pt  Chief Complaint: F/U Cerebral intervention  Brief History:  [name], 49 yo male, presented 12/28/19 with speech difficulty, right-sided weakness and facial asymmetry.  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 Possible small amount of thrombus in the left V4 vertebral artery. 2. Perfusion imaging demonstrates a large area of penumbra involving the posterior circulation without core infarct.  Code Stroke called. He underwent cerebral intervention by Dr. 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 S/P complete revascularization withx1 pass with 5mm x 37 mm embotrap retriever and aspiration followed by stent assisted angioplasty  Achieving a TICI 3 revascularization.  MRI done yesterday showed = 1. Numerous acute infarcts throughout the posterior circulation, including bilateral thalami, bilateral occipital lobes, the cerebellum, and the brainstem. 2. Interval placement of a basilar artery stent. There is flow related signal within the stent and distal to the sent, compatible with patency.  Subjective:  He c/o some distance blurry vision and diplopia.  He state he is feeling better today. Wife at bedside.  Allergies: Patient has no known allergies.  Medications: Prior to Admission medications   Medication Sig Start Date End Date Taking? Authorizing Provider  Cyanocobalamin (VITAMIN B12 PO) Take 1 tablet by mouth daily.   Yes [provider]  ELIQUIS 5 MG TABS tablet Take 5 mg by mouth in the morning and at bedtime. 10/11/19  Yes [provider]  folic acid (FOLVITE) 1 MG tablet Take 1 mg by mouth daily. 11/10/19  Yes [provider]  hydrochlorothiazide (HYDRODIURIL) 25 MG  tablet Take 25 mg by mouth daily. 11/08/19  Yes [provider]  losartan (COZAAR) 50 MG tablet Take 50 mg by mouth daily. 11/03/19  Yes [provider]  NIFEdipine (ADALAT CC) 60 MG 24 hr tablet Take 60 mg by mouth daily. 11/08/19  Yes [provider]  omeprazole (PRILOSEC) 20 MG capsule Take 20 mg by mouth daily.   Yes [provider]     Vital Signs: BP (!) 138/93   Pulse 65   Temp 98.5 F (36.9 C) (Oral)   Resp 17   Ht  (1.803 m)   Wt 120.2 kg   SpO2 96%   BMI 36.96 kg/m   Physical Exam Cardiovascular:     Rate and Rhythm: Normal rate.  Pulmonary:     Effort: Pulmonary effort is normal. No respiratory distress.  Musculoskeletal:     Comments: 5/5 strength all fours  Skin:    General: Skin is warm and dry.  Psychiatric:        Mood and Affect: Mood normal.        Behavior: Behavior normal.        Thought Content: Thought content normal.        Judgment: Judgment normal.   Alert, awake, and oriented x3. Speech and comprehension intact. PERRL bilaterally. EOMs intact bilaterally without nystagmus. There is subjective diplopia. Unable to read lettering on door, states it's blurry.  Visual fields intact bilaterally. No facial asymmetry. Tongue midline. Motor power symmetric proportional to effort. No pronator drift. Fine motor and coordination intact and symmetric. 5/5 Strength bilaterally Common femoral artery puncture site looks good, no bleeding, no hematoma, no pseudoaneurysm Gait not assessed. Romberg not assessed. Heel to toe not assessed.  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 Subsequent encounter. EXAM: CT HEAD WITHOUT CONTRAST TECHNIQUE: Contiguous axial images were obtained from the base of the skull through the vertex without intravenous contrast. COMPARISON:  CT head 1144  hours today. CTA and CT Perfusion earlier today. FINDINGS: Brain: No acute intracranial hemorrhage identified. No midline shift, mass effect, or evidence of intracranial mass lesion. No ventriculomegaly. Stable since this morning and relatively mild patchy hypodensity in the right cerebellar PICA territory (series 3, image 29). Gray-white matter differentiation in the deep gray nuclei, the brainstem and the left cerebellar hemisphere appears stable and within normal limits. There are possible small bilateral occipital pole cortical infarcts (series 3, image 22). But elsewhere supratentorial gray-white matter differentiation is stable. Vascular: New distal left vertebral artery through proximal basilar vascular stent. Possible mild residual intravascular contrast. No suspicious intracranial vascular hyperdensity. Skull: No acute osseous abnormality identified. Sinuses/Orbits: Stable paranasal sinuses, maxillary sinus mucous retention cysts. Tympanic cavities and mastoids remain clear. Other: No acute orbit or scalp soft tissue finding. IMPRESSION: 1. New small bilateral Occipital pole cortical infarcts suspected. But stable CT appearance of mild Right PICA infarct since this morning, and no associated hemorrhage or mass effect. 2. And elsewhere stable CT appearance of the brain status post endovascular treatment of Basilar Artery thrombosis. 3. New vascular stent from the dominant distal left vertebral through the proximal basilar. Electronically Signed   By: Odessa Fleming M.D.   On: 12/28/2019 20:13   MR ANGIO HEAD WO CONTRAST  Result Date: 12/29/2019 CLINICAL DATA:  Stroke follow-up. EXAM: MRI HEAD WITHOUT CONTRAST MRA HEAD WITHOUT CONTRAST TECHNIQUE: Multiplanar, multiecho pulse sequences of the brain and surrounding structures were obtained without intravenous contrast. Angiographic images of the head were obtained using MRA technique without contrast. COMPARISON:  None. FINDINGS: MRI HEAD FINDINGS Brain: Numerous  acute infarcts throughout the posterior circulation, including bilateral thalami, bilateral occipital lobes, the cerebellum, and the brainstem. There are areas of associated edema in these regions without substantial mass effect. The fourth ventricle remains patent. No hydrocephalus. No midline shift. Basal cisterns are patent. No evidence of mass occupying hemorrhagic transformation. Skull and upper cervical spine: Normal marrow signal. Sinuses/Orbits: Retention cyst in bilateral maxillary sinuses. Ethmoid air cell mucosal thickening. Other: Small right mastoid effusion. MRA HEAD FINDINGS Interval placement of a basilar artery stent. There is flow related signal within the stent and distal to the sent, compatible with patency. No evidence of significant stenosis of the visualized V4 left vertebral artery. The non dominant right vertebral artery has poor flow related signal, which may relate to its diminutive size. No evidence of significant stenosis of the basilar artery distal to the stent or bilateral posterior cerebral arteries. No evidence of significant stenosis in the anterior circulation. Small right A1 ACA with compensatory enlarged left A1 ACA without focal stenosis. No significant stenosis of the A2 ACAs. IMPRESSION: 1. Numerous acute infarcts throughout the posterior circulation, including bilateral thalami, bilateral occipital lobes, the cerebellum, and the brainstem. 2. Interval placement of a basilar artery stent. There is flow related signal within the stent and distal to the sent, compatible with patency. CTA could provide better evaluation of the stent if clinically indicated. Findings discussed with Dr. Roda Shutters at 12:59 PM via telephone. Electronically Signed   By: Feliberto Harts MD   On: 12/29/2019 13:01   MR BRAIN WO CONTRAST  Result Date:  12/29/2019 CLINICAL DATA:  Stroke follow-up. EXAM: MRI HEAD WITHOUT CONTRAST MRA HEAD WITHOUT CONTRAST TECHNIQUE: Multiplanar, multiecho pulse sequences of  the brain and surrounding structures were obtained without intravenous contrast. Angiographic images of the head were obtained using MRA technique without contrast. COMPARISON:  None. FINDINGS: MRI HEAD FINDINGS Brain: Numerous acute infarcts throughout the posterior circulation, including bilateral thalami, bilateral occipital lobes, the cerebellum, and the brainstem. There are areas of associated edema in these regions without substantial mass effect. The fourth ventricle remains patent. No hydrocephalus. No midline shift. Basal cisterns are patent. No evidence of mass occupying hemorrhagic transformation. Skull and upper cervical spine: Normal marrow signal. Sinuses/Orbits: Retention cyst in bilateral maxillary sinuses. Ethmoid air cell mucosal thickening. Other: Small right mastoid effusion. MRA HEAD FINDINGS Interval placement of a basilar artery stent. There is flow related signal within the stent and distal to the sent, compatible with patency. No evidence of significant stenosis of the visualized V4 left vertebral artery. The non dominant right vertebral artery has poor flow related signal, which may relate to its diminutive size. No evidence of significant stenosis of the basilar artery distal to the stent or bilateral posterior cerebral arteries. No evidence of significant stenosis in the anterior circulation. Small right A1 ACA with compensatory enlarged left A1 ACA without focal stenosis. No significant stenosis of the A2 ACAs. IMPRESSION: 1. Numerous acute infarcts throughout the posterior circulation, including bilateral thalami, bilateral occipital lobes, the cerebellum, and the brainstem. 2. Interval placement of a basilar artery stent. There is flow related signal within the stent and distal to the sent, compatible with patency. CTA could provide better evaluation of the stent if clinically indicated. Findings discussed with Dr. Roda Shutters at 12:59 PM via telephone. Electronically Signed   By: Feliberto Harts MD   On: 12/29/2019 13:01   CT CEREBRAL PERFUSION W CONTRAST  Addendum Date: 12/28/2019   ADDENDUM REPORT: 12/28/2019 12:29 ADDENDUM: CORRECTION. In the findings it mentions infarct in the posterior circulation in the perfusion section. This was a typo. There is no core infarct on perfusion, but there is a large area of penumbra. This was clarified with Dr. Thomasena Edis at 12:28 PM via telephone. Electronically Signed   By: Feliberto Harts MD   On: 12/28/2019 12:29   Result Date: 12/28/2019 CLINICAL DATA:  Right-sided weakness, blurry vision, vomiting, headaches. EXAM: CT ANGIOGRAPHY HEAD AND NECK CT PERFUSION BRAIN TECHNIQUE: Multidetector CT imaging of the head and neck was performed using the standard protocol during bolus administration of intravenous contrast. Multiplanar CT image reconstructions and MIPs were obtained to evaluate the vascular anatomy. Carotid stenosis measurements (when applicable) are obtained utilizing NASCET criteria, using the distal internal carotid diameter as the denominator. Multiphase CT imaging of the brain was performed following IV bolus contrast injection. Subsequent parametric perfusion maps were calculated using RAPID software. CONTRAST:  OMNIPAQUE IOHEXOL 350 MG/ML SOLN COMPARISON:  None. FINDINGS: CTA NECK FINDINGS Aortic arch: Imaged portion shows no evidence of aneurysm or dissection. No significant stenosis of the major arch vessel origins. Right carotid system: Mild narrowing of the proximal right ICA, likely related to atherosclerosis. No evidence of greater than 50% narrowing or occlusion. Left carotid system: No evidence of greater than 50% stenosis or occlusion. Vertebral arteries: Left dominant. No evidence of greater than 50% stenosis or occlusion. Possible tiny filling defect within the left V4 vertebral artery at the level of the PICA origin (series 7, image 146). The right vertebral artery is diminutive throughout its course.  Skeleton: Mild  multilevel degenerative changes of the cervical spine. Other neck: No mass or adenopathy. Unremarkable appearance of the thyroid gland. Upper chest: Paraseptal and centrilobular emphysema. No consolidation. Review of the MIP images confirms the above findings CTA HEAD FINDINGS Anterior circulation: No evidence of greater than 50% stenosis or occlusion. Bilateral M1 and proximal M2 MCA branches appear widely patent. Diminutive right A1 ACA, likely congenital variation given large left A1 ACA. Bilateral A2 ACA is are well opacified. No evidence of aneurysm. Posterior circulation: There is focal filling defect within the proximal to mid severe narrowing of the artery. Basilar artery, compatible with intraluminal thrombus which results in severe narrowing. Thrombus does not appear to extend to the superior cerebellar or posterior cerebral origins. No evidence of greater than 50% narrowing of the posterior cerebral arteries. No evidence of aneurysm. Venous sinuses: Limited visualization given contrast bolus timing. Review of the MIP images confirms the above findings CT Brain Perfusion Findings: CBF (<30%) Volume: 0 mLmL Perfusion (Tmax>6.0s) volume: Mismatch Volume: Infarction Location:Posterior circulation, including cerebellum and occipital lobes. IMPRESSION: 1. 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 Possible small amount of thrombus in the left V4 vertebral artery. 2. Perfusion imaging demonstrates a large area of penumbra involving the posterior circulation without core infarct. Code stroke imaging results were communicated on 12/28/2019 at 12:01 p.m. to provider Dr. Thomasena Edis via telephone, who verbally acknowledged these results. Electronically Signed: By: Feliberto Harts MD On: 12/28/2019 12:17   ECHOCARDIOGRAM COMPLETE  Result Date: 12/29/2019    ECHOCARDIOGRAM REPORT   Patient Name:   [name] Date of Exam:  12/29/2019 Medical Rec #:  [id number]    Height: Accession #:    [id number]   Weight:       265.0 lb Date of Birth:  1970/12/25    BSA:          2.436 m Patient Age:    49 years     BP:           131/78 mmHg Patient Gender: M            HR:           79 bpm. Exam Location:  Inpatient Procedure: 2D Echo, Cardiac Doppler, Color Doppler and Intracardiac            Opacification Agent Indications:    Stroke 434.91/II63.9  History:        Patient has no prior history of Echocardiogram examinations.  Sonographer:    Roosvelt Maser RDCS Referring Phys: [id number] HUNTER J COLLINS IMPRESSIONS  1. 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 1 x 1.8 cm apical density/filling defect on contrast imaging that is semi mobile consistent with apical thrombus. . Left ventricular ejection fraction, by estimation, is 55 to 60%. The left ventricle has normal function. The left ventricle demonstrates regional wall motion abnormalities (see scoring diagram/findings for description). There is severe left ventricular hypertrophy. Left ventricular diastolic parameters are consistent with Grade I diastolic dysfunction (impaired  relaxation). Elevated left atrial pressure. There is akinesis of the left ventricular, apical segment.  2. Right ventricular systolic function is normal. The right ventricular size is normal.  3. The mitral valve is normal in structure. No evidence of mitral valve regurgitation. No evidence of mitral stenosis.  4. The aortic valve is tricuspid. Aortic valve regurgitation is not visualized. No aortic stenosis is present.  5. Apical thrombus reported to Dr Roda Shutters through epic direct  messaging. FINDINGS  Left Ventricle: The apex is akinetic. 1 x 1.8 cm apical density/filling defect on contrast imaging that is semi mobile consistent with apical thrombus. Left ventricular ejection fraction, by estimation, is 55 to 60%. The left ventricle has normal function. The left ventricle demonstrates regional wall motion abnormalities. Definity contrast agent was  given IV to delineate the left ventricular endocardial borders. The left ventricular internal cavity size was normal in size. There is severe left ventricular hypertrophy. Left ventricular diastolic parameters are consistent with Grade I diastolic dysfunction (impaired relaxation). Elevated left atrial pressure. Right Ventricle: The right ventricular size is normal. No increase in right ventricular wall thickness. Right ventricular systolic function is normal. Left Atrium: Left atrial size was normal in size. Right Atrium: Right atrial size was normal in size. Pericardium: There is no evidence of pericardial effusion. Mitral Valve: The mitral valve is normal in structure. No evidence of mitral valve regurgitation. No evidence of mitral valve stenosis. Tricuspid Valve: The tricuspid valve is normal in structure. Tricuspid valve regurgitation is not demonstrated. No evidence of tricuspid stenosis. Aortic Valve: The aortic valve is tricuspid. Aortic valve regurgitation is not visualized. No aortic stenosis is present. Aortic valve mean gradient measures 5.1 mmHg. Aortic valve peak gradient measures 9.7 mmHg. Aortic valve area, by VTI measures 3.87 cm. Pulmonic Valve: The pulmonic valve was not well visualized. Pulmonic valve regurgitation is not visualized. No evidence of pulmonic stenosis. Aorta: The aortic root is normal in size and structure. Pulmonary Artery: Indeterminant PASP, inadequate TR jet. IAS/Shunts: No atrial level shunt detected by color flow Doppler.  LEFT VENTRICLE PLAX 2D LVIDd:         5.10 cm      Diastology LVIDs:         3.40 cm      LV e' medial:    4.35 cm/s LV PW:         1.50 cm      LV E/e' medial:  17.8 LV IVS:        1.50 cm      LV e' lateral:   4.46 cm/s LVOT diam:     2.50 cm      LV E/e' lateral: 17.4 LV SV:         107 LV SV Index:   44 LVOT Area:     4.91 cm  LV Volumes (MOD) LV vol d, MOD A2C: 146.0 ml LV vol d, MOD A4C: 58.0 ml LV vol s, MOD A2C: 30.6 ml LV vol s, MOD A4C: 23.7 ml  LV SV MOD A2C:     115.4 ml LV SV MOD A4C:     58.0 ml LV SV MOD BP:      65.5 ml RIGHT VENTRICLE RV Basal diam:  3.90 cm LEFT ATRIUM              Index       RIGHT ATRIUM           Index LA diam:        4.70 cm  1.93 cm/m  RA Area:     26.80 cm LA Vol (A2C):   120.0 ml 49.26 ml/m RA Volume:   85.80 ml  35.22 ml/m LA Vol (A4C):   88.2 ml  36.21 ml/m LA Biplane Vol: 108.0 ml 44.33 ml/m  AORTIC VALVE AV Area (Vmax):    3.81 cm AV Area (Vmean):   3.69 cm AV Area (VTI):     3.87 cm AV Vmax:  156.10 cm/s AV Vmean:          105.627 cm/s AV VTI:            0.275 m AV Peak Grad:      9.7 mmHg AV Mean Grad:      5.1 mmHg LVOT Vmax:         121.27 cm/s LVOT Vmean:        79.349 cm/s LVOT VTI:          0.217 m LVOT/AV VTI ratio: 0.79  AORTA Ao Root diam: 3.60 cm Ao Asc diam:  3.60 cm MITRAL VALVE MV Area (PHT): 4.06 cm     SHUNTS MV Decel Time: 187 msec     Systemic VTI:  0.22 m MV E velocity: 77.60 cm/s   Systemic Diam: 2.50 cm MV A velocity: 109.00 cm/s MV E/A ratio:  0.71 Dina Rich MD Electronically signed by Dina Rich MD Signature Date/Time: 12/29/2019/10:23:45 AM    Final    CT HEAD CODE STROKE WO CONTRAST  Result Date: 12/28/2019 CLINICAL DATA:  Code stroke. Dizziness, headache, right-sided weakness. EXAM: CT HEAD WITHOUT CONTRAST TECHNIQUE: Contiguous axial images were obtained from the base of the skull through the vertex without intravenous contrast. COMPARISON:  None. FINDINGS: Brain: No acute hemorrhage. Query linear area of hypoattenuation in the right cerebellum (series 3, image 7), possibly acute or subacute infarct. No hydrocephalus. No mass lesion. 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 Vascular: Mild apparent hyperdensity of the basilar artery, which may relate to streak artifact, but warrants follow-up on forthcoming CTA. Calcific atherosclerosis. Skull: No acute fracture. Sinuses/Orbits: Right maxillary sinus retention cyst. Remote  right medial orbital wall fracture. Other: No mastoid effusions. ASPECTS [facility] Stroke Program Early CT Score) Total score (0-10 with 10 being normal): 10 IMPRESSION: 1. Possible acute/subacute right cerebellar infarct. MRI could further evaluate if clinically indicated. 2. Mild apparent hyperdensity of the basilar artery, which may relate to streak artifact, but warrants follow-up on forthcoming CTA. 3. Otherwise, no evidence of acute large vascular territory infarct or acute hemorrhage. 4. ASPECTS is 10 Code stroke imaging results were communicated on 12/28/2019 at 11:55 am to provider Dr. Thomasena Edis via telephone, who verbally acknowledged these results. Electronically Signed   By: Feliberto Harts MD   On: 12/28/2019 11:59   CT ANGIO HEAD CODE STROKE  Addendum Date: 12/28/2019   ADDENDUM REPORT: 12/28/2019 12:29 ADDENDUM: CORRECTION. In the findings it mentions infarct in the posterior circulation in the perfusion section. This was a typo. There is no core infarct on perfusion, but there is a large area of penumbra. This was clarified with Dr. Thomasena Edis at 12:28 PM via telephone. Electronically Signed   By: Feliberto Harts MD   On: 12/28/2019 12:29   Result Date: 12/28/2019 CLINICAL DATA:  Right-sided weakness, blurry vision, vomiting, headaches. EXAM: CT ANGIOGRAPHY HEAD AND NECK CT PERFUSION BRAIN TECHNIQUE: Multidetector CT imaging of the head and neck was performed using the standard protocol during bolus administration of intravenous contrast. Multiplanar CT image reconstructions and MIPs were obtained to evaluate the vascular anatomy. Carotid stenosis measurements (when applicable) are obtained utilizing NASCET criteria, using the distal internal carotid diameter as the denominator. Multiphase CT imaging of the brain was performed following IV bolus contrast injection. Subsequent parametric perfusion maps were calculated using RAPID software. CONTRAST:  OMNIPAQUE IOHEXOL 350 MG/ML SOLN  COMPARISON:  None. FINDINGS: CTA NECK FINDINGS Aortic arch: Imaged portion shows no evidence of aneurysm or  dissection. No significant stenosis of the major arch vessel origins. Right carotid system: Mild narrowing of the proximal right ICA, likely related to atherosclerosis. No evidence of greater than 50% narrowing or occlusion. Left carotid system: No evidence of greater than 50% stenosis or occlusion. Vertebral arteries: Left dominant. No evidence of greater than 50% stenosis or occlusion. Possible tiny filling defect within the left V4 vertebral artery at the level of the PICA origin (series 7, image 146). The right vertebral artery is diminutive throughout its course. Skeleton: Mild multilevel degenerative changes of the cervical spine. Other neck: No mass or adenopathy. Unremarkable appearance of the thyroid gland. Upper chest: Paraseptal and centrilobular emphysema. No consolidation. Review of the MIP images confirms the above findings CTA HEAD FINDINGS Anterior circulation: No evidence of greater than 50% stenosis or occlusion. Bilateral M1 and proximal M2 MCA branches appear widely patent. Diminutive right A1 ACA, likely congenital variation given large left A1 ACA. Bilateral A2 ACA is are well opacified. No evidence of aneurysm. Posterior circulation: There is focal filling defect within the proximal to mid severe narrowing of the artery. Basilar artery, compatible with intraluminal thrombus which results in severe narrowing. Thrombus does not appear to extend to the superior cerebellar or posterior cerebral origins. No evidence of greater than 50% narrowing of the posterior cerebral arteries. No evidence of aneurysm. Venous sinuses: Limited visualization given contrast bolus timing. Review of the MIP images confirms the above findings CT Brain Perfusion Findings: CBF (<30%) Volume: 0 mLmL Perfusion (Tmax>6.0s) volume: Mismatch Volume: Infarction Location:Posterior circulation, including  cerebellum and occipital lobes. IMPRESSION: 1. 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 Possible small amount of thrombus in the left V4 vertebral artery. 2. Perfusion imaging demonstrates a large area of penumbra involving the posterior circulation without core infarct. Code stroke imaging results were communicated on 12/28/2019 at 12:01 p.m. to provider Dr. Thomasena Edis via telephone, who verbally acknowledged these results. Electronically Signed: By: Feliberto Harts MD On: 12/28/2019 12:17   CT ANGIO NECK CODE STROKE  Result Date: 12/28/2019 CLINICAL DATA:  Right-sided weakness, blurred vision, vomiting, headaches. EXAM: CT ANGIOGRAPHY HEAD AND NECK TECHNIQUE: Multidetector CT imaging of the head and neck was performed using the standard protocol during bolus administration of intravenous contrast. Multiplanar CT image reconstructions and MIPs were obtained to evaluate the vascular anatomy. Carotid stenosis measurements (when applicable) are obtained utilizing NASCET criteria, using the distal internal carotid diameter as the denominator. CONTRAST:  OMNIPAQUE IOHEXOL 350 MG/ML SOLN COMPARISON:  None. FINDINGS: CTA NECK FINDINGS Aortic arch: Imaged portion shows no evidence of aneurysm or dissection. No significant stenosis of the major arch vessel origins. Right carotid system: Mild narrowing of the proximal right ICA, likely related to atherosclerosis. No evidence of greater than 50% narrowing or occlusion. Left carotid system: No evidence of greater than 50% stenosis or occlusion. Vertebral arteries: Left dominant. No evidence of greater than 50% stenosis or occlusion. Possible tiny filling defect within the left V4 vertebral artery at the level of the PICA origin (series 7, image 146). The right vertebral artery is diminutive throughout its course. Skeleton: Mild multilevel degenerative changes of the cervical spine. Other  neck: No mass or adenopathy. Unremarkable appearance of the thyroid gland. Upper chest: Paraseptal and centrilobular emphysema. No consolidation. Review of the MIP images confirms the above findings CTA HEAD FINDINGS Anterior circulation: No evidence of greater than 50% stenosis or occlusion. Bilateral M1 and proximal M2  MCA branches appear widely patent. Diminutive right A1 ACA, likely congenital variation given large left A1 ACA. Bilateral A2 ACA is are well opacified. No evidence of aneurysm. Posterior circulation: There is focal filling defect within the proximal to mid severe narrowing of the artery. Basilar artery, compatible with intraluminal thrombus which results in severe narrowing. Thrombus does not appear to extend to the superior cerebellar or posterior cerebral origins. No evidence of greater than 50% narrowing of the posterior cerebral arteries. No evidence of aneurysm. Venous sinuses: Limited visualization given contrast bolus timing. Review of the MIP images confirms the above findings CT Brain Perfusion Findings: CBF (<30%) Volume: 0 mLmL Perfusion (Tmax>6.0s) volume: Mismatch Volume: Penumbra: Posterior circulation, including cerebellum and occipital lobes. IMPRESSION: 1. 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 Possible small amount of thrombus in the left V4 vertebral artery. 2. Perfusion imaging demonstrates a large area of penumbra involving the posterior circulation without core infarct. Code stroke imaging results were communicated on 12/28/2019 at 12: 01 p.m. to provider Dr. Thomasena Edis via telephone, who verbally acknowledged these results. Electronically Signed By: Feliberto Harts MD On: 12/28/2019 12:17 Electronically Signed   By: Feliberto Harts MD   On: 12/28/2019 12:27    Labs:  CBC: Recent Labs    12/28/19 1141 12/28/19 1143 12/29/19 0500  WBC 15.7*  --  19.1*  HGB 18.1* 19.7* 15.5   HCT 56.0* 58.0* 46.8  PLT 338  --  297    COAGS: Recent Labs    12/28/19 1141 12/29/19 2108  INR 1.0  --   APTT 29 46*    BMP: Recent Labs    12/28/19 1141 12/28/19 1143 12/29/19 0500  NA 138 139 136  K 3.9 3.9 3.9  CL 101 101 103  CO2 22  --  22  GLUCOSE 145* 143* 138*  BUN 7 8 9   CALCIUM 9.9  --  8.9  CREATININE 1.33* 1.20 0.96  GFRNONAA >60  --  >60  GFRAA >60  --  >60    LIVER FUNCTION TESTS: Recent Labs    12/28/19 1141  BILITOT 1.6*  AST 25  ALT 29  ALKPHOS 95  PROT 7.8  ALBUMIN 4.4    Assessment and Plan:  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  S/P complete revascularization withx1 pass with 5mm x 37 mm embotrap retriever and aspiration followed by stent assisted angioplasty  Achieving a TICI 3 revascularization.  Brilinta for at least 3 months, also recommend aspirin.  I will arrange follow up with Dr. Corliss Skains in about 4 weeks. We will call patient with appointment details.  Electronically Signed: Gwynneth Macleod, PA-C 12/30/2019, 10:15 AM    I spent a total of 15 Minutes at the the patient's bedside AND on the patient's hospital floor or unit, greater than 50% of which was counseling/coordinating care for f/u stroke.

## 2019-12-30 NOTE — Evaluation (Signed)
Occupational Therapy Evaluation Patient Details Name: [name] MRN: [id number] DOB: 01-09-1971 Today's Date: 12/30/2019    History of Present Illness Pt is a 49 y.o. male who presented to the ED with nausea, dizziness, speech difficulty, right-sided weakness and facial asymmetry. He has a history of multiple venous occlusions and was started on Eliquis but stopped taking it on Wednesday, 9/29. CT revealed posterior infarcts with BA thrombus. Pt s/p thrombectomy and vascular stent from the dominant distal left vertebral through the proximal basilar. Repeat CT following IR procedures showed new small bilateral occipital pole cortical infarcts suspected but stable CT appearance of mild right PICA infarct, and no associated hemorrhage or mass effect.   Clinical Impression   This 49 y/o male presents with the above. PTA pt independent with ADL, iADL and functional mobility, working in [employer]. Pt very pleasant and motivated to work with therapy. Pt overall requiring minA for room level mobility (via HHA), pt unsteady with dynamic tasks and with intermittent LOB requiring minA to correct. Overall he requires minA for ADL. Pt also with notable visual deficits with pt endorsing blurred vision, impacting his safety with ADL and mobility. Pt's son present and supportive throughout session. Pt to benefit from continued acute OT services and currently recommend CIR level therapies at time of discharge to maximize his overall safety and independence with ADL and mobility. Will follow.     Follow Up Recommendations  CIR    Equipment Recommendations  Tub/shower seat           Precautions / Restrictions Precautions Precautions: Fall Restrictions Weight Bearing Restrictions: No      Mobility Bed Mobility Overal bed mobility: Needs Assistance Bed Mobility: Supine to Sit     Supine to sit: Min guard;HOB elevated     General bed mobility comments: for lines and safety, no physical assist  required   Transfers Overall transfer level: Needs assistance Equipment used: None;1 person hand held assist Transfers: Sit to/from Stand Sit to Stand: Min assist         General transfer comment: to steady initially with standing     Balance Overall balance assessment: Needs assistance Sitting-balance support: Feet supported Sitting balance-Leahy Scale: Good     Standing balance support: Single extremity supported;During functional activity Standing balance-Leahy Scale: Poor Standing balance comment: reliant on external assist                            ADL either performed or assessed with clinical judgement   ADL Overall ADL's : Needs assistance/impaired Eating/Feeding: Modified independent;Sitting   Grooming: Minimal assistance;Standing;Wash/dry face Grooming Details (indicate cue type and reason): for balance  Upper Body Bathing: Supervision/ safety;Sitting   Lower Body Bathing: Minimal assistance;Sit to/from stand   Upper Body Dressing : Supervision/safety;Sitting   Lower Body Dressing: Minimal assistance;Sit to/from stand Lower Body Dressing Details (indicate cue type and reason): pt using figure 4 to adjust socks seated EOB, minA for standing balance  Toilet Transfer: Minimal assistance;Ambulation Toilet Transfer Details (indicate cue type and reason): unsteady with mobility requiring consistent assist for balance  Toileting- Clothing Manipulation and Hygiene: Minimal assistance;Sit to/from stand;Sitting/lateral lean       Functional mobility during ADLs: Minimal assistance General ADL Comments: pt with intermittent LOB with mobility in room today, pt aware of deficits, requires minA to correct      Vision Patient Visual Report: Blurring of vision Vision Assessment?: Yes;Vision impaired- to be further tested in  functional context Eye Alignment: Impaired (comment) (slight disconjugate gaze ) Ocular Range of Motion: Restricted on the  left Alignment/Gaze Preference: Within Defined Limits Tracking/Visual Pursuits: Left eye does not track laterally;Right eye does not track medially;Decreased smoothness of horizontal tracking;Impaired - to be further tested in functional context;Unable to hold eye position out of midline;Decreased smoothness of eye movement to LEFT superior field Visual Fields: Impaired-to be further tested in functional context Additional Comments: pt cannot track towards L visual field - with attempts eyes revert back to midline ; pt having difficulty reading large font in room; will continue to further assess      Perception     Praxis      Pertinent Vitals/Pain Pain Assessment: No/denies pain Faces Pain Scale: No hurt     Hand Dominance Right   Extremity/Trunk Assessment Upper Extremity Assessment Upper Extremity Assessment: RUE deficits/detail RUE Deficits / Details: pt does endorse decreased sensation in R digits (most notably R thumb); ROM and strength grossly symmetrical to LUE RUE Sensation: decreased light touch RUE Coordination: WNL   Lower Extremity Assessment Lower Extremity Assessment: Defer to PT evaluation   Cervical / Trunk Assessment Cervical / Trunk Assessment: Normal   Communication Communication Communication: No difficulties   Cognition Arousal/Alertness: Awake/alert Behavior During Therapy: WFL for tasks assessed/performed Overall Cognitive Status: Impaired/Different from baseline Area of Impairment: Problem solving;Awareness                           Awareness: Emergent Problem Solving: Slow processing     General Comments  VSS     Exercises     Shoulder Instructions      Home Living Family/patient expects to be discharged to:: Private residence Living Arrangements: Children (son) Available Help at Discharge: Family;Available 24 hours/day Type of Home: House Home Access: Stairs to enter [employer] of Steps: 3-4   Home Layout: One  level     Bathroom Shower/Tub: Producer, television/film/video: Standard     Home Equipment: None      Lives With: Son    Prior Functioning/Environment Level of Independence: Independent        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[employer], Marketing executive        OT Problem List: Decreased strength;Decreased range of motion;Decreased activity tolerance;Impaired balance (sitting and/or standing);Impaired vision/perception;Decreased coordination;Decreased cognition;Decreased knowledge of use of DME or AE      OT Treatment/Interventions: Self-care/ADL training;Therapeutic exercise;Neuromuscular education;Energy conservation;DME and/or AE instruction;Therapeutic activities;Cognitive remediation/compensation;Patient/family education;Balance training    OT Goals(Current goals can be found in the care plan section) Acute Rehab OT Goals Patient Stated Goal: independence OT Goal Formulation: With patient Time For Goal Achievement: 01/13/20 Potential to Achieve Goals: Good  OT Frequency: Min 2X/week (will benefit from 3x)   Barriers to D/C:            Co-evaluation              AM-PAC OT "6 Clicks" Daily Activity     Outcome Measure Help from another person eating meals?: None Help from another person taking care of personal grooming?: A Little Help from another person toileting, which includes using toliet, bedpan, or urinal?: A Little Help from another person bathing (including washing, rinsing, drying)?: A Little Help from another person to put on and taking off regular upper body clothing?: None Help from another person to put on and taking off regular lower body clothing?: A Little 6 Click Score: 20  End of Session Equipment Utilized During Treatment: Gait belt Nurse Communication: Mobility status  Activity Tolerance: Patient tolerated treatment well Patient left: in chair;with call bell/phone within reach;with family/visitor present  OT Visit Diagnosis: Other  abnormalities of gait and mobility (R26.89);Other symptoms and signs involving the nervous system (R29.898);Unsteadiness on feet (R26.81)                Time: 1093-2355 OT Time Calculation (min): 26 min Charges:  OT General Charges $OT Visit: 1 Visit OT Evaluation $OT Eval Moderate Complexity: 1 Mod OT Treatments $Self Care/Home Management : 8-22 mins  Marcy Siren, OT Acute Rehabilitation Services Pager [phone] Office [phone]   Orlando Penner 12/30/2019, 1:03 PM

## 2019-12-30 NOTE — Progress Notes (Signed)
Rehab Admissions Coordinator Note:  Patient was screened by Clois Dupes for appropriateness for an Inpatient Acute Rehab Consult per therapy recs. .  At this time, we are recommending Inpatient Rehab consult. I will place order per protocol.  Clois Dupes RN MSN  12/30/2019, 8:26 AM  I can be reached at [phone].

## 2019-12-31 DIAGNOSIS — I748 Embolism and thrombosis of other arteries: Secondary | ICD-10-CM

## 2019-12-31 LAB — HEPARIN LEVEL (UNFRACTIONATED)
Heparin Unfractionated: 0.29 IU/mL — ABNORMAL LOW (ref 0.30–0.70)
Heparin Unfractionated: 0.26 IU/mL — ABNORMAL LOW (ref 0.30–0.70)
Heparin Unfractionated: 0.24 IU/mL — ABNORMAL LOW (ref 0.30–0.70)
Heparin Unfractionated: 0.35 IU/mL (ref 0.30–0.70)

## 2019-12-31 LAB — BASIC METABOLIC PANEL
Anion gap: 11 (ref 5–15)
BUN: 12 mg/dL (ref 6–20)
CO2: 23 mmol/L (ref 22–32)
Calcium: 9.4 mg/dL (ref 8.9–10.3)
Chloride: 101 mmol/L (ref 98–111)
Creatinine, Ser: 0.85 mg/dL (ref 0.61–1.24)
GFR calc Af Amer: >60 mL/min (ref >60)
GFR calc non Af Amer: >60 mL/min (ref >60)
Glucose, Bld: 99 mg/dL (ref 70–99)
Potassium: 3.7 mmol/L (ref 3.5–5.1)
Sodium: 135 mmol/L (ref 135–145)

## 2019-12-31 LAB — CBC
HCT: 48.5 % (ref 39.0–52.0)
Hemoglobin: 15.5 g/dL (ref 13.0–17.0)
MCH: 28.1 pg (ref 26.0–34.0)
MCHC: 32 g/dL (ref 30.0–36.0)
MCV: 87.9 fL (ref 80.0–100.0)
Platelets: 220 10*3/uL (ref 150–400)
RBC: 5.52 MIL/uL (ref 4.22–5.81)
RDW: 14.6 % (ref 11.5–15.5)
WBC: 9.2 10*3/uL (ref 4.0–10.5)
nRBC: 0 % (ref 0.0–0.2)

## 2019-12-31 LAB — PROTIME-INR
INR: 1 (ref 0.8–1.2)
Prothrombin Time: 12.8 seconds (ref 11.4–15.2)

## 2019-12-31 MED ORDER — WARFARIN SODIUM 5 MG PO TABS
5.0000 mg | ORAL_TABLET | Freq: Once | ORAL | Status: AC
  Administered 2019-12-31: 5 mg via ORAL
  Filled 2019-12-31: qty 1

## 2019-12-31 NOTE — Progress Notes (Signed)
Referring Physician(s): Code stroke  Supervising Physician: Julieanne Cotton  Patient Status:  [facility] - In-pt  Chief Complaint: Follow up left basilar artery thrombectomy 10/2 in NIR  Subjective:  Patient sitting up watching TV, finishing breakfast. No visitors/staff at bedside during exam. Patient reports his vision has continued to improve from yesterday, can see the TV much more clearly and no diplopia. He is feeling much better overall. Per patient plan is for CIR on Thursday unless he continues to improve significantly then he will likely be discharged to home with home health services.  Allergies: Patient has no known allergies.  Medications: Prior to Admission medications   Medication Sig Start Date End Date Taking? Authorizing Provider  Cyanocobalamin (VITAMIN B12 PO) Take 1 tablet by mouth daily.   Yes [provider]  ELIQUIS 5 MG TABS tablet Take 5 mg by mouth in the morning and at bedtime. 10/11/19  Yes [provider]  folic acid (FOLVITE) 1 MG tablet Take 1 mg by mouth daily. 11/10/19  Yes [provider]  hydrochlorothiazide (HYDRODIURIL) 25 MG tablet Take 25 mg by mouth daily. 11/08/19  Yes [provider]  losartan (COZAAR) 50 MG tablet Take 50 mg by mouth daily. 11/03/19  Yes [provider]  NIFEdipine (ADALAT CC) 60 MG 24 hr tablet Take 60 mg by mouth daily. 11/08/19  Yes [provider]  omeprazole (PRILOSEC) 20 MG capsule Take 20 mg by mouth daily.   Yes [provider]     Vital Signs: BP 135/90   Pulse 80   Temp 98.1 F (36.7 C) (Oral)   Resp 12   Ht 5\' 11"  (1.803 m)   Wt 264 lb 15.9 oz (120.2 kg)   SpO2 100%   BMI 36.96 kg/m   Physical Exam Vitals reviewed.  Constitutional:      General: He is not in acute distress. HENT:     Head: Normocephalic.  Cardiovascular:     Rate and Rhythm: Normal rate.  Pulmonary:     Effort: Pulmonary effort is normal.  Skin:    General: Skin is dry.    Neurological:     Mental Status: He is alert and oriented to person, place, and time.     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 Subsequent encounter. EXAM: CT HEAD WITHOUT CONTRAST TECHNIQUE: Contiguous axial images were obtained from the base of the skull through the vertex without intravenous contrast. COMPARISON:  CT head 1144 hours today. CTA and CT Perfusion earlier today. FINDINGS: Brain: No acute intracranial hemorrhage identified. No midline shift, mass effect, or evidence of intracranial mass lesion. No ventriculomegaly. Stable since this morning and relatively mild patchy hypodensity in the right cerebellar PICA territory (series 3, image 29). Gray-white matter differentiation in the deep gray nuclei, the brainstem and the left cerebellar hemisphere appears stable and within normal limits. There are possible small bilateral occipital pole cortical infarcts (series 3, image 22). But elsewhere supratentorial gray-white matter differentiation is stable. Vascular: New distal left vertebral artery through proximal basilar vascular stent. Possible mild residual intravascular contrast. No suspicious intracranial vascular hyperdensity. Skull: No acute osseous abnormality identified. Sinuses/Orbits: Stable paranasal sinuses, maxillary sinus mucous retention cysts. Tympanic cavities and mastoids remain clear. Other: No acute orbit or scalp soft tissue finding. IMPRESSION: 1. New small bilateral Occipital pole cortical infarcts suspected. But stable CT appearance of mild Right PICA  infarct since this morning, and no associated hemorrhage or mass effect. 2. And elsewhere stable CT appearance of the brain status post endovascular treatment of Basilar Artery thrombosis. 3. New vascular stent from the dominant distal left vertebral through the proximal basilar.  Electronically Signed   By: Odessa Fleming M.D.   On: 12/28/2019 20:13   MR ANGIO HEAD WO CONTRAST  Result Date: 12/29/2019 CLINICAL DATA:  Stroke follow-up. EXAM: MRI HEAD WITHOUT CONTRAST MRA HEAD WITHOUT CONTRAST TECHNIQUE: Multiplanar, multiecho pulse sequences of the brain and surrounding structures were obtained without intravenous contrast. Angiographic images of the head were obtained using MRA technique without contrast. COMPARISON:  None. FINDINGS: MRI HEAD FINDINGS Brain: Numerous acute infarcts throughout the posterior circulation, including bilateral thalami, bilateral occipital lobes, the cerebellum, and the brainstem. There are areas of associated edema in these regions without substantial mass effect. The fourth ventricle remains patent. No hydrocephalus. No midline shift. Basal cisterns are patent. No evidence of mass occupying hemorrhagic transformation. Skull and upper cervical spine: Normal marrow signal. Sinuses/Orbits: Retention cyst in bilateral maxillary sinuses. Ethmoid air cell mucosal thickening. Other: Small right mastoid effusion. MRA HEAD FINDINGS Interval placement of a basilar artery stent. There is flow related signal within the stent and distal to the sent, compatible with patency. No evidence of significant stenosis of the visualized V4 left vertebral artery. The non dominant right vertebral artery has poor flow related signal, which may relate to its diminutive size. No evidence of significant stenosis of the basilar artery distal to the stent or bilateral posterior cerebral arteries. No evidence of significant stenosis in the anterior circulation. Small right A1 ACA with compensatory enlarged left A1 ACA without focal stenosis. No significant stenosis of the A2 ACAs. IMPRESSION: 1. Numerous acute infarcts throughout the posterior circulation, including bilateral thalami, bilateral occipital lobes, the cerebellum, and the brainstem. 2. Interval placement of a basilar artery stent.  There is flow related signal within the stent and distal to the sent, compatible with patency. CTA could provide better evaluation of the stent if clinically indicated. Findings discussed with Dr. Roda Shutters at 12:59 PM via telephone. Electronically Signed   By: Feliberto Harts MD   On: 12/29/2019 13:01   MR BRAIN WO CONTRAST  Result Date: 12/29/2019 CLINICAL DATA:  Stroke follow-up. EXAM: MRI HEAD WITHOUT CONTRAST MRA HEAD WITHOUT CONTRAST TECHNIQUE: Multiplanar, multiecho pulse sequences of the brain and surrounding structures were obtained without intravenous contrast. Angiographic images of the head were obtained using MRA technique without contrast. COMPARISON:  None. FINDINGS: MRI HEAD FINDINGS Brain: Numerous acute infarcts throughout the posterior circulation, including bilateral thalami, bilateral occipital lobes, the cerebellum, and the brainstem. There are areas of associated edema in these regions without substantial mass effect. The fourth ventricle remains patent. No hydrocephalus. No midline shift. Basal cisterns are patent. No evidence of mass occupying hemorrhagic transformation. Skull and upper cervical spine: Normal marrow signal. Sinuses/Orbits: Retention cyst in bilateral maxillary sinuses. Ethmoid air cell mucosal thickening. Other: Small right mastoid effusion. MRA HEAD FINDINGS Interval placement of a basilar artery stent. There is flow related signal within the stent and distal to the sent, compatible with patency. No evidence of significant stenosis of the visualized V4 left vertebral artery. The non dominant right vertebral artery has poor flow related signal, which may relate to its diminutive size. No evidence of significant stenosis of the basilar artery distal to the stent or bilateral posterior cerebral arteries. No evidence of significant stenosis in the anterior circulation. Small right  A1 ACA with compensatory enlarged left A1 ACA without focal stenosis. No significant stenosis of  the A2 ACAs. IMPRESSION: 1. Numerous acute infarcts throughout the posterior circulation, including bilateral thalami, bilateral occipital lobes, the cerebellum, and the brainstem. 2. Interval placement of a basilar artery stent. There is flow related signal within the stent and distal to the sent, compatible with patency. CTA could provide better evaluation of the stent if clinically indicated. Findings discussed with Dr. Roda Shutters at 12:59 PM via telephone. Electronically Signed   By: Feliberto Harts MD   On: 12/29/2019 13:01   CT CEREBRAL PERFUSION W CONTRAST  Addendum Date: 12/28/2019   ADDENDUM REPORT: 12/28/2019 12:29 ADDENDUM: CORRECTION. In the findings it mentions infarct in the posterior circulation in the perfusion section. This was a typo. There is no core infarct on perfusion, but there is a large area of penumbra. This was clarified with Dr. Thomasena Edis at 12:28 PM via telephone. Electronically Signed   By: Feliberto Harts MD   On: 12/28/2019 12:29   Result Date: 12/28/2019 CLINICAL DATA:  Right-sided weakness, blurry vision, vomiting, headaches. EXAM: CT ANGIOGRAPHY HEAD AND NECK CT PERFUSION BRAIN TECHNIQUE: Multidetector CT imaging of the head and neck was performed using the standard protocol during bolus administration of intravenous contrast. Multiplanar CT image reconstructions and MIPs were obtained to evaluate the vascular anatomy. Carotid stenosis measurements (when applicable) are obtained utilizing NASCET criteria, using the distal internal carotid diameter as the denominator. Multiphase CT imaging of the brain was performed following IV bolus contrast injection. Subsequent parametric perfusion maps were calculated using RAPID software. CONTRAST:  OMNIPAQUE IOHEXOL 350 MG/ML SOLN COMPARISON:  None. FINDINGS: CTA NECK FINDINGS Aortic arch: Imaged portion shows no evidence of aneurysm or dissection. No significant stenosis of the major arch vessel origins. Right carotid system: Mild  narrowing of the proximal right ICA, likely related to atherosclerosis. No evidence of greater than 50% narrowing or occlusion. Left carotid system: No evidence of greater than 50% stenosis or occlusion. Vertebral arteries: Left dominant. No evidence of greater than 50% stenosis or occlusion. Possible tiny filling defect within the left V4 vertebral artery at the level of the PICA origin (series 7, image 146). The right vertebral artery is diminutive throughout its course. Skeleton: Mild multilevel degenerative changes of the cervical spine. Other neck: No mass or adenopathy. Unremarkable appearance of the thyroid gland. Upper chest: Paraseptal and centrilobular emphysema. No consolidation. Review of the MIP images confirms the above findings CTA HEAD FINDINGS Anterior circulation: No evidence of greater than 50% stenosis or occlusion. Bilateral M1 and proximal M2 MCA branches appear widely patent. Diminutive right A1 ACA, likely congenital variation given large left A1 ACA. Bilateral A2 ACA is are well opacified. No evidence of aneurysm. Posterior circulation: There is focal filling defect within the proximal to mid severe narrowing of the artery. Basilar artery, compatible with intraluminal thrombus which results in severe narrowing. Thrombus does not appear to extend to the superior cerebellar or posterior cerebral origins. No evidence of greater than 50% narrowing of the posterior cerebral arteries. No evidence of aneurysm. Venous sinuses: Limited visualization given contrast bolus timing. Review of the MIP images confirms the above findings CT Brain Perfusion Findings: CBF (<30%) Volume: 0 mLmL Perfusion (Tmax>6.0s) volume: Mismatch Volume: Infarction Location:Posterior circulation, including cerebellum and occipital lobes. IMPRESSION: 1. 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 Possible small amount of thrombus in the  left V4 vertebral  artery. 2. Perfusion imaging demonstrates a large area of penumbra involving the posterior circulation without core infarct. Code stroke imaging results were communicated on 12/28/2019 at 12:01 p.m. to provider Dr. Thomasena Edis via telephone, who verbally acknowledged these results. Electronically Signed: By: Feliberto Harts MD On: 12/28/2019 12:17   ECHOCARDIOGRAM COMPLETE  Result Date: 12/29/2019    ECHOCARDIOGRAM REPORT   Patient Name:   [name] Date of Exam: 12/29/2019 Medical Rec #:  [id number]    Height: Accession #:    [id number]   Weight:       265.0 lb Date of Birth:  [birth year]    BSA:          2.436 m Patient Age:    49 years     BP:           131/78 mmHg Patient Gender: M            HR:           79 bpm. Exam Location:  Inpatient Procedure: 2D Echo, Cardiac Doppler, Color Doppler and Intracardiac            Opacification Agent Indications:    Stroke 434.91/II63.9  History:        Patient has no prior history of Echocardiogram examinations.  Sonographer:    Roosvelt Maser RDCS Referring Phys: [id number] [facility] IMPRESSIONS  1. 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 1 x 1.8 cm apical density/filling defect on contrast imaging that is semi mobile consistent with apical thrombus. . Left ventricular ejection fraction, by estimation, is 55 to 60%. The left ventricle has normal function. The left ventricle demonstrates regional wall motion abnormalities (see scoring diagram/findings for description). There is severe left ventricular hypertrophy. Left ventricular diastolic parameters are consistent with Grade I diastolic dysfunction (impaired  relaxation). Elevated left atrial pressure. There is akinesis of the left ventricular, apical segment.  2. Right ventricular systolic function is normal. The right ventricular size is normal.  3. The mitral valve is normal in structure. No evidence of mitral valve regurgitation. No evidence of mitral stenosis.  4. The aortic valve is tricuspid. Aortic valve  regurgitation is not visualized. No aortic stenosis is present.  5. Apical thrombus reported to Dr Roda Shutters through epic direct messaging. FINDINGS  Left Ventricle: The apex is akinetic. 1 x 1.8 cm apical density/filling defect on contrast imaging that is semi mobile consistent with apical thrombus. Left ventricular ejection fraction, by estimation, is 55 to 60%. The left ventricle has normal function. The left ventricle demonstrates regional wall motion abnormalities. Definity contrast agent was given IV to delineate the left ventricular endocardial borders. The left ventricular internal cavity size was normal in size. There is severe left ventricular hypertrophy. Left ventricular diastolic parameters are consistent with Grade I diastolic dysfunction (impaired relaxation). Elevated left atrial pressure. Right Ventricle: The right ventricular size is normal. No increase in right ventricular wall thickness. Right ventricular systolic function is normal. Left Atrium: Left atrial size was normal in size. Right Atrium: Right atrial size was normal in size. Pericardium: There is no evidence of pericardial effusion. Mitral Valve: The mitral valve is normal in structure. No evidence of mitral valve regurgitation. No evidence of mitral valve stenosis. Tricuspid Valve: The tricuspid valve is normal in structure. Tricuspid valve regurgitation is not demonstrated. No evidence of tricuspid stenosis. Aortic Valve: The aortic valve is tricuspid. Aortic valve regurgitation is not visualized. No aortic stenosis is present. Aortic valve mean gradient measures 5.1 mmHg. Aortic valve peak  gradient measures 9.7 mmHg. Aortic valve area, by VTI measures 3.87 cm. Pulmonic Valve: The pulmonic valve was not well visualized. Pulmonic valve regurgitation is not visualized. No evidence of pulmonic stenosis. Aorta: The aortic root is normal in size and structure. Pulmonary Artery: Indeterminant PASP, inadequate TR jet. IAS/Shunts: No atrial level  shunt detected by color flow Doppler.  LEFT VENTRICLE PLAX 2D LVIDd:         5.10 cm      Diastology LVIDs:         3.40 cm      LV e' medial:    4.35 cm/s LV PW:         1.50 cm      LV E/e' medial:  17.8 LV IVS:        1.50 cm      LV e' lateral:   4.46 cm/s LVOT diam:     2.50 cm      LV E/e' lateral: 17.4 LV SV:         107 LV SV Index:   44 LVOT Area:     4.91 cm  LV Volumes (MOD) LV vol d, MOD A2C: 146.0 ml LV vol d, MOD A4C: 58.0 ml LV vol s, MOD A2C: 30.6 ml LV vol s, MOD A4C: 23.7 ml LV SV MOD A2C:     115.4 ml LV SV MOD A4C:     58.0 ml LV SV MOD BP:      65.5 ml RIGHT VENTRICLE RV Basal diam:  3.90 cm LEFT ATRIUM              Index       RIGHT ATRIUM           Index LA diam:        4.70 cm  1.93 cm/m  RA Area:     26.80 cm LA Vol (A2C):   120.0 ml 49.26 ml/m RA Volume:   85.80 ml  35.22 ml/m LA Vol (A4C):   88.2 ml  36.21 ml/m LA Biplane Vol: 108.0 ml 44.33 ml/m  AORTIC VALVE AV Area (Vmax):    3.81 cm AV Area (Vmean):   3.69 cm AV Area (VTI):     3.87 cm AV Vmax:           156.10 cm/s AV Vmean:          105.627 cm/s AV VTI:            0.275 m AV Peak Grad:      9.7 mmHg AV Mean Grad:      5.1 mmHg LVOT Vmax:         121.27 cm/s LVOT Vmean:        79.349 cm/s LVOT VTI:          0.217 m LVOT/AV VTI ratio: 0.79  AORTA Ao Root diam: 3.60 cm Ao Asc diam:  3.60 cm MITRAL VALVE MV Area (PHT): 4.06 cm     SHUNTS MV Decel Time: 187 msec     Systemic VTI:  0.22 m MV E velocity: 77.60 cm/s   Systemic Diam: 2.50 cm MV A velocity: 109.00 cm/s MV E/A ratio:  0.71 Dina Rich MD Electronically signed by Dina Rich MD Signature Date/Time: 12/29/2019/10:23:45 AM    Final    CT HEAD CODE STROKE WO CONTRAST  Result Date: 12/28/2019 CLINICAL DATA:  Code stroke. Dizziness, headache, right-sided weakness. EXAM: CT HEAD WITHOUT CONTRAST TECHNIQUE: Contiguous axial images were obtained from the base of  the skull through the vertex without intravenous contrast. COMPARISON:  None. FINDINGS: Brain: No acute  hemorrhage. Query linear area of hypoattenuation in the right cerebellum (series 3, image 7), possibly acute or subacute infarct. No hydrocephalus. No mass lesion. 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 Vascular: Mild apparent hyperdensity of the basilar artery, which may relate to streak artifact, but warrants follow-up on forthcoming CTA. Calcific atherosclerosis. Skull: No acute fracture. Sinuses/Orbits: Right maxillary sinus retention cyst. Remote right medial orbital wall fracture. Other: No mastoid effusions. ASPECTS [facility] Stroke Program Early CT Score) Total score (0-10 with 10 being normal): 10 IMPRESSION: 1. Possible acute/subacute right cerebellar infarct. MRI could further evaluate if clinically indicated. 2. Mild apparent hyperdensity of the basilar artery, which may relate to streak artifact, but warrants follow-up on forthcoming CTA. 3. Otherwise, no evidence of acute large vascular territory infarct or acute hemorrhage. 4. ASPECTS is 10 Code stroke imaging results were communicated on 12/28/2019 at 11:55 am to provider Dr. Thomasena Edis via telephone, who verbally acknowledged these results. Electronically Signed   By: Feliberto Harts MD   On: 12/28/2019 11:59   CT ANGIO HEAD CODE STROKE  Addendum Date: 12/28/2019   ADDENDUM REPORT: 12/28/2019 12:29 ADDENDUM: CORRECTION. In the findings it mentions infarct in the posterior circulation in the perfusion section. This was a typo. There is no core infarct on perfusion, but there is a large area of penumbra. This was clarified with Dr. Thomasena Edis at 12:28 PM via telephone. Electronically Signed   By: Feliberto Harts MD   On: 12/28/2019 12:29   Result Date: 12/28/2019 CLINICAL DATA:  Right-sided weakness, blurry vision, vomiting, headaches. EXAM: CT ANGIOGRAPHY HEAD AND NECK CT PERFUSION BRAIN TECHNIQUE: Multidetector CT imaging of the head and neck was performed using the standard protocol during bolus  administration of intravenous contrast. Multiplanar CT image reconstructions and MIPs were obtained to evaluate the vascular anatomy. Carotid stenosis measurements (when applicable) are obtained utilizing NASCET criteria, using the distal internal carotid diameter as the denominator. Multiphase CT imaging of the brain was performed following IV bolus contrast injection. Subsequent parametric perfusion maps were calculated using RAPID software. CONTRAST:  OMNIPAQUE IOHEXOL 350 MG/ML SOLN COMPARISON:  None. FINDINGS: CTA NECK FINDINGS Aortic arch: Imaged portion shows no evidence of aneurysm or dissection. No significant stenosis of the major arch vessel origins. Right carotid system: Mild narrowing of the proximal right ICA, likely related to atherosclerosis. No evidence of greater than 50% narrowing or occlusion. Left carotid system: No evidence of greater than 50% stenosis or occlusion. Vertebral arteries: Left dominant. No evidence of greater than 50% stenosis or occlusion. Possible tiny filling defect within the left V4 vertebral artery at the level of the PICA origin (series 7, image 146). The right vertebral artery is diminutive throughout its course. Skeleton: Mild multilevel degenerative changes of the cervical spine. Other neck: No mass or adenopathy. Unremarkable appearance of the thyroid gland. Upper chest: Paraseptal and centrilobular emphysema. No consolidation. Review of the MIP images confirms the above findings CTA HEAD FINDINGS Anterior circulation: No evidence of greater than 50% stenosis or occlusion. Bilateral M1 and proximal M2 MCA branches appear widely patent. Diminutive right A1 ACA, likely congenital variation given large left A1 ACA. Bilateral A2 ACA is are well opacified. No evidence of aneurysm. Posterior circulation: There is focal filling defect within the proximal to mid severe narrowing of the artery. Basilar artery, compatible with intraluminal thrombus which results in severe  narrowing. Thrombus does  not appear to extend to the superior cerebellar or posterior cerebral origins. No evidence of greater than 50% narrowing of the posterior cerebral arteries. No evidence of aneurysm. Venous sinuses: Limited visualization given contrast bolus timing. Review of the MIP images confirms the above findings CT Brain Perfusion Findings: CBF (<30%) Volume: 0 mLmL Perfusion (Tmax>6.0s) volume: Mismatch Volume: Infarction Location:Posterior circulation, including cerebellum and occipital lobes. IMPRESSION: 1. 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 Possible small amount of thrombus in the left V4 vertebral artery. 2. Perfusion imaging demonstrates a large area of penumbra involving the posterior circulation without core infarct. Code stroke imaging results were communicated on 12/28/2019 at 12:01 p.m. to provider Dr. Thomasena Edis via telephone, who verbally acknowledged these results. Electronically Signed: By: Feliberto Harts MD On: 12/28/2019 12:17   CT ANGIO NECK CODE STROKE  Result Date: 12/28/2019 CLINICAL DATA:  Right-sided weakness, blurred vision, vomiting, headaches. EXAM: CT ANGIOGRAPHY HEAD AND NECK TECHNIQUE: Multidetector CT imaging of the head and neck was performed using the standard protocol during bolus administration of intravenous contrast. Multiplanar CT image reconstructions and MIPs were obtained to evaluate the vascular anatomy. Carotid stenosis measurements (when applicable) are obtained utilizing NASCET criteria, using the distal internal carotid diameter as the denominator. CONTRAST:  OMNIPAQUE IOHEXOL 350 MG/ML SOLN COMPARISON:  None. FINDINGS: CTA NECK FINDINGS Aortic arch: Imaged portion shows no evidence of aneurysm or dissection. No significant stenosis of the major arch vessel origins. Right carotid system: Mild narrowing of the proximal right ICA, likely related to  atherosclerosis. No evidence of greater than 50% narrowing or occlusion. Left carotid system: No evidence of greater than 50% stenosis or occlusion. Vertebral arteries: Left dominant. No evidence of greater than 50% stenosis or occlusion. Possible tiny filling defect within the left V4 vertebral artery at the level of the PICA origin (series 7, image 146). The right vertebral artery is diminutive throughout its course. Skeleton: Mild multilevel degenerative changes of the cervical spine. Other neck: No mass or adenopathy. Unremarkable appearance of the thyroid gland. Upper chest: Paraseptal and centrilobular emphysema. No consolidation. Review of the MIP images confirms the above findings CTA HEAD FINDINGS Anterior circulation: No evidence of greater than 50% stenosis or occlusion. Bilateral M1 and proximal M2 MCA branches appear widely patent. Diminutive right A1 ACA, likely congenital variation given large left A1 ACA. Bilateral A2 ACA is are well opacified. No evidence of aneurysm. Posterior circulation: There is focal filling defect within the proximal to mid severe narrowing of the artery. Basilar artery, compatible with intraluminal thrombus which results in severe narrowing. Thrombus does not appear to extend to the superior cerebellar or posterior cerebral origins. No evidence of greater than 50% narrowing of the posterior cerebral arteries. No evidence of aneurysm. Venous sinuses: Limited visualization given contrast bolus timing. Review of the MIP images confirms the above findings CT Brain Perfusion Findings: CBF (<30%) Volume: 0 mLmL Perfusion (Tmax>6.0s) volume: Mismatch Volume: Penumbra: Posterior circulation, including cerebellum and occipital lobes. IMPRESSION: 1. 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 Possible small amount of thrombus in the left V4 vertebral artery. 2. Perfusion imaging demonstrates a  large area of penumbra involving the posterior circulation without core infarct. Code stroke imaging results were communicated on 12/28/2019 at 12: 01 p.m. to provider Dr. Thomasena Edis via telephone, who verbally acknowledged these results. Electronically Signed By: Feliberto Harts MD On:  12/28/2019 12:17 Electronically Signed   By: Feliberto Harts MD   On: 12/28/2019 12:27    Labs:  CBC: Recent Labs    12/28/19 1141 12/28/19 1141 12/28/19 1143 12/29/19 0500 12/30/19 0938 12/31/19 0053  WBC 15.7*  --   --  19.1* 9.0 9.2  HGB 18.1*   < > 19.7* 15.5 15.0 15.5  HCT 56.0*   < > 58.0* 46.8 47.2 48.5  PLT 338  --   --  297 216 220   < > = values in this interval not displayed.    COAGS: Recent Labs    12/28/19 1141 12/29/19 2108 12/31/19 0053  INR 1.0  --  1.0  APTT 29 46*  --     BMP: Recent Labs    12/28/19 1141 12/28/19 1141 12/28/19 1143 12/29/19 0500 12/30/19 0938 12/31/19 0053  NA 138   < > 139 136 135 135  K 3.9   < > 3.9 3.9 3.9 3.7  CL 101   < > 101 103 101 101  CO2 22  --   --  22 24 23   GLUCOSE 145*   < > 143* 138* 96 99  BUN 7   < > 8 9 10 12   CALCIUM 9.9  --   --  8.9 9.2 9.4  CREATININE 1.33*   < > 1.20 0.96 0.97 0.85  GFRNONAA >60  --   --  >60 >60 >60  GFRAA >60  --   --  >60 >60 >60   < > = values in this interval not displayed.    LIVER FUNCTION TESTS: Recent Labs    12/28/19 1141  BILITOT 1.6*  AST 25  ALT 29  ALKPHOS 95  PROT 7.8  ALBUMIN 4.4    Assessment and Plan:  49 y/o M who presented as a code stroke 10/2 s/p left basilar artery thrombectomy in NIR achieving TICI 3 revascularization seen today for routine follow up.  Vision continues to improve, feeling well overall. Plan is for CIR later this week vs possible home with home health if he continues to improve significantly.  We will plan to see patient 4-5 weeks post discharge for follow up in NIR. He is to continue on ASA 81 mg QD + Brilinta 90 mg BID. All questions answered, he  is agreeable to this plan.  NIR remains available as needed - please call with questions or concerns.  Electronically Signed: Villa Herb, PA-C 12/31/2019, 9:19 AM   I spent a total of 15 Minutes at the the patient's bedside AND on the patient's hospital floor or unit, greater than 50% of which was counseling/coordinating care for left basilar artery thrombectomy follow up.

## 2019-12-31 NOTE — Progress Notes (Signed)
   Chart reviewed- appreciate NIR recommendations regarding DAPT duration. Would continue bridging therapy and target INR on warfarin to 2.0-2.5 to minimize bleeding risk on "triple therapy". Again, recommend outpatient ischemia evaluation given evidence of recent anterior infarct.  No further suggestions at this time.  [facility] will sign off.   Medication Recommendations:  As above Other recommendations (labs, testing, etc):  none Follow up as an outpatient:  [place] [facility] and coumadin clinic (we will arrange).  Chrystie Nose, MD, [facility], FACP  [place]  [facility]  Medical Director of the [facility] Lipid [facility] &  Cardiovascular Risk Reduction Clinic Diplomate of the American Board of Clinical Lipidology Attending Cardiologist  Direct Dial: [phone]  Fax: [phone]  Website:  [email or web address]
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
## 2019-12-31 NOTE — Progress Notes (Signed)
STROKE TEAM PROGRESS NOTE   INTERVAL HISTORY No family is at the bedside.  Pt is sitting in chair. Stated mild dizziness on movement, still has blurry vision.  On heparin drip and coumadin now. INR today 1.0. still on ASA and brilinta for the basilar artery stenting. PT/OT recommend CIR.    OBJECTIVE Vitals:   12/31/19 0900 12/31/19 0941 12/31/19 1150 12/31/19 1958  BP: 135/90 131/85 130/87 131/85  Pulse: 80 77 69 89  Resp: 12 16 16 17   Temp:  98.2 F (36.8 C) 98.3 F (36.8 C) 98.3 F (36.8 C)  TempSrc:  Oral Oral Oral  SpO2: 100% 98% 97% 98%  Weight:      Height:        CBC:  Recent Labs  Lab 12/28/19 1141 12/28/19 1143 12/29/19 0500 12/29/19 0500 12/30/19 0938 12/31/19 0053  WBC 15.7*   < > 19.1*   < > 9.0 9.2  NEUTROABS 12.0*  --  16.6*  --   --   --   HGB 18.1*   < > 15.5   < > 15.0 15.5  HCT 56.0*   < > 46.8   < > 47.2 48.5  MCV 87.1   < > 87.5   < > 88.2 87.9  PLT 338   < > 297   < > 216 220   < > = values in this interval not displayed.    Basic Metabolic Panel:  Recent Labs  Lab 12/30/19 0938 12/31/19 0053  NA 135 135  K 3.9 3.7  CL 101 101  CO2 24 23  GLUCOSE 96 99  BUN 10 12  CREATININE 0.97 0.85  CALCIUM 9.2 9.4    Lipid Panel:     Component Value Date/Time   CHOL 163 12/30/2019 0938   TRIG 153 (H) 12/30/2019 0938   HDL 30 (L) 12/30/2019 0938   CHOLHDL 5.4 12/30/2019 0938   VLDL 31 12/30/2019 0938   LDLCALC 102 (H) 12/30/2019 0938   HgbA1c:  Lab Results  Component Value Date   HGBA1C 6.0 (H) 12/29/2019   Urine Drug Screen:     Component Value Date/Time   LABOPIA NONE DETECTED 12/29/2019 1610   COCAINSCRNUR NONE DETECTED 12/29/2019 1610   LABBENZ NONE DETECTED 12/29/2019 1610   AMPHETMU NONE DETECTED 12/29/2019 1610   THCU NONE DETECTED 12/29/2019 1610   LABBARB NONE DETECTED 12/29/2019 1610    Alcohol Level     Component Value Date/Time   ETH (H) 02/23/2007 0515    378        LOWEST DETECTABLE LIMIT FOR SERUM ALCOHOL IS  11 mg/dL FOR MEDICAL PURPOSES ONLY    IMAGING  CT HEAD WO CONTRAST 12/28/2019  IMPRESSION:  1. New small bilateral Occipital pole cortical infarcts suspected. But stable CT appearance of mild Right PICA infarct since this morning, and no associated hemorrhage or mass effect.  2. And elsewhere stable CT appearance of the brain status post endovascular treatment of Basilar Artery thrombosis.  3. New vascular stent from the dominant distal left vertebral through the proximal basilar.   CT HEAD CODE STROKE WO CONTRAST 12/28/2019 IMPRESSION:  1. Possible acute/subacute right cerebellar infarct. MRI could further evaluate if clinically indicated.  2. Mild apparent hyperdensity of the basilar artery, which may relate to streak artifact, but warrants follow-up on forthcoming CTA.  3. Otherwise, no evidence of acute large vascular territory infarct or acute hemorrhage.   CT ANGIO HEAD CODE STROKE CT CEREBRAL PERFUSION W CONTRAST CT ANGIO  NECK CODE STROKE 12/28/2019   ADDENDUM: CORRECTION. In the findings it mentions infarct in the posterior circulation in the perfusion section. This was a typo. There is no core infarct on perfusion, but there is a large area of penumbra. This was clarified with Dr. Thomasena Edis  IMPRESSION:  1. 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 Possible small amount of thrombus in the left V4 vertebral artery.  2. Perfusion imaging demonstrates a large area of penumbra involving the posterior circulation without core infarct.   Neuro Interventional Radiology - Cerebral Angiogram with Intervention 12/28/19 3:53 PM S/P  Lt Common carotid and Lt Vertebral artrriograms . RT CFA approach. Findings. 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 S/P complete revascularization withx1 pass with 46mm x 37 mm embotrap retriever and aspiration followed by stent assisted  angioplasty  Achieving a TICI 3 revascularization. Post CT brain .No ICH or mass effect. No hydrocephalus. hemostasis in the rt groin with a 97F exoseal .  Groin soft. Distal pulses all dopplerable. Extubated. Obeying commands appropriately.Slurred speech. Moving all 4s with mild weakness imn the RT UE. Pupils 79mm Rt = LT. Absent horizontal gaze to the left.No Nystagmus.Says sees better in the Lt VF. Tongue midline. S.Deveshwar MD  Transthoracic Echocardiogram  1. 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 1 x 1.8 cm apical density/filling defect on  contrast imaging that is semi mobile consistent with apical thrombus. .  Left ventricular ejection fraction, by estimation, is 55 to 60%. The left  ventricle has normal function. The  left ventricle demonstrates regional wall motion abnormalities (see  scoring diagram/findings for description). There is severe left  ventricular hypertrophy. Left ventricular diastolic parameters are  consistent with Grade I diastolic dysfunction (impaired  relaxation). Elevated left atrial pressure. There is akinesis of the left  ventricular, apical segment.  2. Right ventricular systolic function is normal. The right ventricular  size is normal.  3. The mitral valve is normal in structure. No evidence of mitral valve  regurgitation. No evidence of mitral stenosis.  4. The aortic valve is tricuspid. Aortic valve regurgitation is not  visualized. No aortic stenosis is present.  5. Apical thrombus reported to Dr Roda Shutters through epic direct messaging.   ECG - SR rate 71 BPM. Possible previous infarcts (See cardiology reading for complete details)  PHYSICAL EXAM  Temp:  [98.1 F (36.7 C)-98.4 F (36.9 C)] 98.3 F (36.8 C) (10/05 1958) Pulse Rate:  [61-89] 89 (10/05 1958) Resp:  [10-17] 17 (10/05 1958) BP: (115-140)/(79-90) 131/85 (10/05 1958) SpO2:  [93 %-100 %] 98 % (10/05 1958)  General - Well nourished, well developed, in no apparent distress.  Ophthalmologic - fundi  not visualized due to noncooperation.  Cardiovascular - Regular rhythm and rate.  Neuro - awake alert, orientated x 4. No aphasia, no significant dysarthria, following all commands. Able to name and repeat. Visual field full, PERRL. However, left gaze palsy with nystagmus in both eyes. Right gaze right eye incomplete. Downward gaze rotational nystagmus. Facial symmetrical. Tongue midline. BUE and BLE equal strength, no ataxia. Sensation subjectively symmetrical. Gait not tested.    ASSESSMENT/PLAN [name] is a 49 y.o. male with history of multiple venous occlusions and was started on Eliquis but stopped taking it about 2 days ago. He presented to [facility] ED after he woke feeling dizzy and nauseated. He then noticed speech difficulty, right-sided weakness and facial asymmetry.  He did not  receive IV t-PA due to late presentation (>4.5 hours from time of onset). 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 S/P complete revascularization withx1 pass with 66mm x 37 mm embotrap retriever and aspiration followed by stent assisted angioplasty.  Achieving a TICI 3 revascularization.  Stroke: posterior infarcts with BA thrombus s/p IR and stenting with TICI3, embolic, due to LV thrombus.  Resultant  Left gaze nystagmus  CT Head - Possible acute/subacute right cerebellar infarct    CTA H&N - intraluminal thrombus within the proximal to mid basilar artery with resulting severe narrowing. Possible small amount of thrombus in the left V4 vertebral artery.   CTP - positive penumbra at posterior circulation   IR - stent assisted angioplasty basilar artery with TICI3  CT head - New small bilateral Occipital pole cortical infarcts suspected. But stable CT appearance of mild Right PICA infarct since this morning, and no associated hemorrhage or mass effect.   MRI head - Numerous acute infarcts throughout the posterior circulation, including bilateral thalami,  bilateral occipital lobes, the cerebellum, and the brainstem.  MRA head - patent BA stent  2D Echo - EF 55-60% but LV thrombus with apex akinetic   Sars Corona Virus 2 - negative  LDL - 102  HgbA1c - 6.0  UDS - neg  VTE prophylaxis - heparin IV  Had been on Eliquis but stopped taking it 2 days ago)) prior to admission, now on aspirin 81 mg daily and Brilinta (ticagrelor) 90 mg bid as well as coumadin bridging with heparin IV      Patient will counseled to be compliant with his antithrombotic medications  Ongoing aggressive stroke risk factor management  Therapy recommendations:  CIR  Disposition:  Pending  LV thrombus with likely apex MI  Per Dr. Allena Katz @ [facility], pt was found to have LV thrombus on CT chest back in 10/2019 and was sent to cardiology, was put on eliquis  This admission TTE showed LV thrombus with apex akinetic   CTA showed intraluminal thrombus within the proximal to mid basilar artery with resulting severe narrowing.  S/p IR with BA stenting and TICI3 revasc  On heparin IV per stroke protocol  Cardiology consulted - outpt card follow up for ischemic work up  Embolism likely due to LV thrombus  08/2019 CT and 09/2019 MRI abdomen found to have splenic A, portal V, splenic V and proximal SMV thrombosis  Followed with Dr. Allena Katz in [place], put on eliquis 5 bid  Discussed with Dr. Carlyle Lipa @ [facility] - He stated that pt recent hypercoagulable lab all negative except homocysteine slightly elevated at 17.1   Missed eliquis for 2-3 days PTA  This admission LV thrombus with BA thrombus  On coumadin bridging with heparin IV  Homocysteine repeat normal range = 13.4  Continue to follow up with Dr. Allena Katz as outpt  Hypertension  Home BP meds: Losartan ; HCTZ ; Nifedipiine  Resume home meds  Cleviprex off now  Stable now . BP goal < 160 post IR  . Long-term BP goal normotensive  Hyperlipidemia  Home Lipid lowering  medication: none   LDL 102, goal < 70  Now on lipitor 40   Continue statin at discharge  Tobacco abuse  Current smoker  Smoking cessation counseling provided  Pt is willing to quit  Other Stroke Risk Factors  Obesity, Body mass index is 36.96 kg/m., recommend weight loss, diet and exercise as appropriate  Alcohol use - ETH 378, recommend no more than 2 drinks per day  Other Active Problems  Code status - Full code  Leukocytosis - WBC's - 15.7->19.1->9.0 (afebrile)  Hospital day # 3   Marvel Plan, MD PhD Stroke Neurology 12/31/2019 10:40 PM   To contact Stroke Continuity provider, please refer to [email or web address]. After hours, contact General Neurology

## 2019-12-31 NOTE — Progress Notes (Signed)
ANTICOAGULATION CONSULT NOTE - Follow Up Consult  Pharmacy Consult for heparin Indication: LV thrombus in setting of CVA  Labs: Recent Labs     0000 12/29/19 0500 12/29/19 2108 12/29/19 2108 12/30/19 1607 12/30/19 1646 12/31/19 0053 12/31/19 0053 12/31/19 0547 12/31/19 1530 12/31/19 2241  HGB   < > 15.5  --   --  15.0  --  15.5  --   --   --   --   HCT  --  46.8  --   --  47.2  --  48.5  --   --   --   --   PLT  --  297  --   --  216  --  220  --   --   --   --   APTT  --   --  46*  --   --   --   --   --   --   --   --   LABPROT  --   --   --   --   --   --  12.8  --   --   --   --   INR  --   --   --   --   --   --  1.0  --   --   --   --   HEPARINUNFRC  --   --  0.20*   < > 0.33   < > 0.29*   < > 0.26* 0.24* 0.35  CREATININE  --  0.96  --   --  0.97  --  0.85  --   --   --   --    < > = values in this interval not displayed.    Assessment/Plan:  49yo male therapeutic on heparin after rate changes. Will continue gtt at current rate and confirm stable with am labs.   Vernard Gambles, PharmD, BCPS  12/31/2019,11:26 PM

## 2019-12-31 NOTE — Progress Notes (Signed)
Inpatient Rehabilitation Admissions Coordinator  I met at bedside with patient to discuss his rehab progress. Unlikely to need an inpt rehab admit at this time. He is aware and feels he has good supports at home. I have updated TOC team and discussed with Caryl Pina, PT. I will follow up tomorrow.  Danne Baxter, RN, MSN Rehab Admissions Coordinator [phone] 12/31/2019 3:17 PM

## 2019-12-31 NOTE — Progress Notes (Signed)
ANTICOAGULATION CONSULT NOTE  Pharmacy Consult for heparin Indication: LV thrombus   Assessment: 49 yom admitted for acute stroke and found to have basilar artery occlusion - no tPA. He is s/p complete revascularization and stent in IR on 10/2. He was on apixaban PTA for abdominal vessel clots but stopped taking it 2d PTA. Echo completed and shows LV thrombus. Pharmacy consulted to begin IV heparin.The patient is also noted to be on ASA + ticagrelor in addition to Heparin. Clarified with the stroke team that the ASA + ticagrelor is to be continued along with the Heparin IV given the patient's history of basilar artery stents (per Dr. Corliss Skains).   Heparin level 0.29 units/ml Goal of Therapy:  Heparin level 0.3-0.5 units/ml Monitor platelets by anticoagulation protocol: Yes   Plan:  -Continue heparin at 1650 units/h -Daily INR, heparin level, CBC  Thanks for allowing pharmacy to be a part of this patient's care.  Talbert Cage, PharmD Clinical Pharmacist 12/31/2019

## 2019-12-31 NOTE — Progress Notes (Addendum)
Patient arrived to 3W20. Alert and oriented x4. Denies pain. Ambulated from wheelchair to bed with stand by assistance. Patient mother is at the bedside. Telemetry called and verified tele box. POC provided to patient . Call bell within reach. Bed in lowest position. Bed alarm on.

## 2019-12-31 NOTE — Discharge Instructions (Signed)
Femoral Site Care This sheet gives you information about how to care for yourself after your procedure. Your health care provider may also give you more specific instructions. If you have problems or questions, contact your health care provider. What can I expect after the procedure? After the procedure, it is common to have:  Bruising that usually fades within 1-2 weeks.  Tenderness at the site. Follow these instructions at home: Wound care 1. Follow instructions from your health care provider about how to take care of your insertion site. Make sure you: ? Wash your hands with soap and water before you change your bandage (dressing). If soap and water are not available, use hand sanitizer. ? Change your dressing as directed- pressure dressing removed 24 hours post-procedure (and switch for bandaid), bandaid removed 72 hours post-procedure 2. Do not take baths, swim, or use a hot tub for 7 days post-procedure. 3. You may shower 48 hours after the procedure or as told by your health care provider. ? Gently wash the site with plain soap and water. ? Pat the area dry with a clean towel. ? Do not rub the site. This may cause bleeding. 4. Check your site every day for signs of infection. Check for: ? Redness, swelling, or pain. ? Fluid or blood. ? Warmth. ? Pus or a bad smell. Activity  Do not stoop, bend, or lift anything that is heavier than 10 lb (4.5 kg) for 2 weeks post-procedure.  Do not drive self for 2 weeks post-procedure. Contact a health care provider if you have:  A fever or chills.  You have redness, swelling, or pain around your insertion site. Get help right away if:  The catheter insertion area swells very fast.  You pass out.  You suddenly start to sweat or your skin gets clammy.  The catheter insertion area is bleeding, and the bleeding does not stop when you hold steady pressure on the area.  The area near or just beyond the catheter insertion site becomes  pale, cool, tingly, or numb. These symptoms may represent a serious problem that is an emergency. Do not wait to see if the symptoms will go away. Get medical help right away. Call your local emergency services (911 in the [place] Do not drive yourself to the hospital.  This information is not intended to replace advice given to you by your health care provider. Make sure you discuss any questions you have with your health care provider. Document Revised: 03/27/2017 Document Reviewed: 03/27/2017 Elsevier Patient Education  2020 [employer] 
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
## 2019-12-31 NOTE — Progress Notes (Signed)
Physical Therapy Treatment Patient Details Name: [name] MRN: [id number] DOB: September 06, 1970 Today's Date: 12/31/2019    History of Present Illness Pt is a 49 y.o. male who presented to the ED with nausea, dizziness, speech difficulty, right-sided weakness and facial asymmetry. He has a history of multiple venous occlusions and was started on Eliquis but stopped taking it on Wednesday, 9/29. CT revealed posterior infarcts with BA thrombus. Pt s/p thrombectomy and vascular stent from the dominant distal left vertebral through the proximal basilar. Repeat CT following IR procedures showed new small bilateral occipital pole cortical infarcts suspected but stable CT appearance of mild right PICA infarct, and no associated hemorrhage or mass effect.    PT Comments    Pt continues to require RW for safe ambulation due to impaired balance/equilibrium and impaired vision. Pt reports "I feel more off today than yesterday." in addition to "my R leg feels more tingly today. "Pt given LE exercises with blue theraband. Worked on standing balance in walker on yellow disc. Acute PT to cont to follow.   Follow Up Recommendations  CIR     Equipment Recommendations  Other (comment) - rolling walker   Recommendations for Other Services Rehab consult     Precautions / Restrictions Precautions Precautions: Fall Precaution Comments: dizziness and impaired vision Restrictions Weight Bearing Restrictions: No    Mobility  Bed Mobility Overal bed mobility: Needs Assistance Bed Mobility: Supine to Sit Rolling: Min guard         General bed mobility comments: increased time, HOB elevated, slow due to onset of dizziness/lightheadedness just a couple of hours ago  Transfers Overall transfer level: Needs assistance Equipment used: None Transfers: Sit to/from Stand Sit to Stand: Min guard         General transfer comment: wide base of support, initial instability with forward/backwards  sway  Ambulation/Gait Ambulation/Gait assistance: Min guard;Min assist Gait Distance (Feet): 150 Feet Assistive device: Rolling walker (2 wheeled);None Gait Pattern/deviations: Step-through pattern;Decreased stride length;Wide base of support Gait velocity: dec Gait velocity interpretation: <1.31 ft/sec, indicative of household ambulator General Gait Details: began ambulation without AD, pt with shuffling gait pattern and unable to clear feet from floor and was reaching for counter, PT, anything he could hold onto. Pt given RW per request and pt much more stable. Pt still has to go slow especially during turns due to onset of instability and dizziness   Stairs             Wheelchair Mobility    Modified Rankin (Stroke Patients Only) Modified Rankin (Stroke Patients Only) Pre-Morbid Rankin Score: No symptoms Modified Rankin: Moderately severe disability     Balance Overall balance assessment: Needs assistance Sitting-balance support: Feet supported Sitting balance-Leahy Scale: Good     Standing balance support: Single extremity supported Standing balance-Leahy Scale: Poor Standing balance comment: reliant on external assist                             Cognition Arousal/Alertness: Awake/alert Behavior During Therapy: WFL for tasks assessed/performed Overall Cognitive Status: Within Functional Limits for tasks assessed                                 General Comments: very aware of his deficits      Exercises General Exercises - Lower Extremity Long Arc Quad: AROM;Right;15 reps;Seated;Strengthening (with blue theraband) Heel Slides: AROM;Right;15 reps;Seated;Strengthening (  with blue theraband) Hip ABduction/ADduction: AROM;Strengthening;Both;15 reps;Seated (with blue theraband) Other Exercises Other Exercises: worked in standing on yellow disc while standing in walker, worked on moving from heel/toe and then raising up on L toes to  increased WB on R LE on disc    General Comments General comments (skin integrity, edema, etc.): vss      Pertinent Vitals/Pain Pain Assessment: No/denies pain    Home Living                      Prior Function            PT Goals (current goals can now be found in the care plan section) Progress towards PT goals: Progressing toward goals    Frequency    Min 4X/week      PT Plan Current plan remains appropriate    Co-evaluation              AM-PAC PT "6 Clicks" Mobility   Outcome Measure  Help needed turning from your back to your side while in a flat bed without using bedrails?: A Little Help needed moving from lying on your back to sitting on the side of a flat bed without using bedrails?: A Little Help needed moving to and from a bed to a chair (including a wheelchair)?: A Little Help needed standing up from a chair using your arms (e.g., wheelchair or bedside chair)?: A Little Help needed to walk in hospital room?: A Little Help needed climbing 3-5 steps with a railing? : A Lot 6 Click Score: 17    End of Session Equipment Utilized During Treatment: Gait belt Activity Tolerance: Patient tolerated treatment well Patient left: in bed;with call bell/phone within reach;with bed alarm set;with family/visitor present Nurse Communication: Mobility status PT Visit Diagnosis: Unsteadiness on feet (R26.81);Other abnormalities of gait and mobility (R26.89)     Time: 5009-3818 PT Time Calculation (min) (ACUTE ONLY): 29 min  Charges:  $Gait Training: 8-22 mins $Neuromuscular Re-education: 8-22 mins                     Lewis Shock, PT, DPT Acute Rehabilitation Services Pager #: [phone] Office #: [phone]    Iona Hansen 12/31/2019, 2:17 PM

## 2020-01-01 ENCOUNTER — Other Ambulatory Visit (HOSPITAL_COMMUNITY): Payer: Self-pay | Admitting: Nurse Practitioner

## 2020-01-01 DIAGNOSIS — I219 Acute myocardial infarction, unspecified: Secondary | ICD-10-CM

## 2020-01-01 DIAGNOSIS — E785 Hyperlipidemia, unspecified: Secondary | ICD-10-CM

## 2020-01-01 DIAGNOSIS — E669 Obesity, unspecified: Secondary | ICD-10-CM

## 2020-01-01 DIAGNOSIS — F17200 Nicotine dependence, unspecified, uncomplicated: Secondary | ICD-10-CM | POA: Diagnosis present

## 2020-01-01 DIAGNOSIS — F1010 Alcohol abuse, uncomplicated: Secondary | ICD-10-CM | POA: Diagnosis present

## 2020-01-01 DIAGNOSIS — I10 Essential (primary) hypertension: Secondary | ICD-10-CM

## 2020-01-01 DIAGNOSIS — E7800 Pure hypercholesterolemia, unspecified: Secondary | ICD-10-CM

## 2020-01-01 DIAGNOSIS — I513 Intracardiac thrombosis, not elsewhere classified: Secondary | ICD-10-CM | POA: Diagnosis present

## 2020-01-01 HISTORY — DX: Essential (primary) hypertension: I10

## 2020-01-01 HISTORY — DX: Acute myocardial infarction, unspecified: I21.9

## 2020-01-01 HISTORY — DX: Nicotine dependence, unspecified, uncomplicated: F17.200

## 2020-01-01 HISTORY — DX: Obesity, unspecified: E66.9

## 2020-01-01 HISTORY — DX: Hyperlipidemia, unspecified: E78.5

## 2020-01-01 HISTORY — DX: Alcohol abuse, uncomplicated: F10.10

## 2020-01-01 LAB — CBC
HCT: 50.8 % (ref 39.0–52.0)
Hemoglobin: 16.4 g/dL (ref 13.0–17.0)
MCH: 28.1 pg (ref 26.0–34.0)
MCHC: 32.3 g/dL (ref 30.0–36.0)
MCV: 87.1 fL (ref 80.0–100.0)
Platelets: 249 10*3/uL (ref 150–400)
RBC: 5.83 MIL/uL — ABNORMAL HIGH (ref 4.22–5.81)
RDW: 14.5 % (ref 11.5–15.5)
WBC: 12.1 10*3/uL — ABNORMAL HIGH (ref 4.0–10.5)
nRBC: 0 % (ref 0.0–0.2)

## 2020-01-01 LAB — BASIC METABOLIC PANEL
Anion gap: 13 (ref 5–15)
BUN: 17 mg/dL (ref 6–20)
CO2: 23 mmol/L (ref 22–32)
Calcium: 9.5 mg/dL (ref 8.9–10.3)
Chloride: 98 mmol/L (ref 98–111)
Creatinine, Ser: 1.13 mg/dL (ref 0.61–1.24)
GFR calc non Af Amer: >60 mL/min (ref >60)
Glucose, Bld: 119 mg/dL — ABNORMAL HIGH (ref 70–99)
Potassium: 3.5 mmol/L (ref 3.5–5.1)
Sodium: 134 mmol/L — ABNORMAL LOW (ref 135–145)

## 2020-01-01 LAB — HEPARIN LEVEL (UNFRACTIONATED): Heparin Unfractionated: 0.37 IU/mL (ref 0.30–0.70)

## 2020-01-01 LAB — PROTIME-INR
INR: 1.1 (ref 0.8–1.2)
Prothrombin Time: 13.3 seconds (ref 11.4–15.2)

## 2020-01-01 MED ORDER — ENOXAPARIN SODIUM 120 MG/0.8ML ~~LOC~~ SOLN: 120.0000 mg | Freq: Two times a day (BID) | SUBCUTANEOUS | 0 refills | Status: DC

## 2020-01-01 MED ORDER — ATORVASTATIN CALCIUM 40 MG PO TABS: 40.0000 mg | ORAL_TABLET | Freq: Every day | ORAL | 2 refills | Status: DC

## 2020-01-01 MED ORDER — TICAGRELOR 90 MG PO TABS: 90.0000 mg | ORAL_TABLET | Freq: Two times a day (BID) | ORAL | 2 refills | Status: DC

## 2020-01-01 MED ORDER — INFLUENZA VAC SPLIT QUAD 0.5 ML IM SUSY
0.5000 mL | PREFILLED_SYRINGE | INTRAMUSCULAR | Status: AC
  Administered 2020-01-01: 0.5 mL via INTRAMUSCULAR
  Filled 2020-01-01: qty 0.5

## 2020-01-01 MED ORDER — WARFARIN SODIUM 7.5 MG PO TABS
7.5000 mg | ORAL_TABLET | Freq: Once | ORAL | Status: AC
  Administered 2020-01-01: 7.5 mg via ORAL
  Filled 2020-01-01: qty 1

## 2020-01-01 MED ORDER — PNEUMOCOCCAL VAC POLYVALENT 25 MCG/0.5ML IJ INJ
0.5000 mL | INJECTION | INTRAMUSCULAR | Status: AC
  Administered 2020-01-01: 0.5 mL via INTRAMUSCULAR
  Filled 2020-01-01: qty 0.5

## 2020-01-01 MED ORDER — ENOXAPARIN SODIUM 120 MG/0.8ML ~~LOC~~ SOLN
120.0000 mg | Freq: Two times a day (BID) | SUBCUTANEOUS | Status: DC
  Administered 2020-01-01: 120 mg via SUBCUTANEOUS
  Filled 2020-01-01 (×2): qty 0.8

## 2020-01-01 MED ORDER — WARFARIN SODIUM 5 MG PO TABS: 7.5000 mg | ORAL_TABLET | Freq: Every day | ORAL | 2 refills | Status: DC

## 2020-01-01 MED FILL — ATORVASTATIN CALCIUM 40 MG: 40 | 30 days supply | Qty: 30 | Fill #0

## 2020-01-01 MED FILL — BRILINTA 90 MG TABLET: 90 | 30 days supply | Qty: 60 | Fill #0

## 2020-01-01 MED FILL — ENOXAPARIN SODIUM 120 MG/0.: 120 | 7 days supply | Qty: 11 | Fill #0

## 2020-01-01 MED FILL — WARFARIN SODIUM 5 MG TABLET: 5 | 30 days supply | Qty: 45 | Fill #0

## 2020-01-01 NOTE — Progress Notes (Signed)
RN received a phone call from pharmacist ordering RN to stop heparin now, also pt will be switched to lovenox and administer ordered dose at 1400. Pt informed and heparin stopped. Will continue to closely monitor pt. Arabella Merles Kolyn Rozario RN.

## 2020-01-01 NOTE — TOC Benefit Eligibility Note (Signed)
Transition of Care [facility]) Benefit Eligibility Note    Patient Details  Name: [name] MRN: [id number] Date of Birth: January 19, 1971   Medication/Dose: Brilinta 90 mg bid for 30 day supply  Covered?: Yes  Tier: 3 Drug  Prescription Coverage [facility]: [facility]  Spoke with Person/Company/Phone Number:: Anselm Lis [employer]  @[phone]  Co-Pay: $40.00  Prior Approval: No  Deductible:  (no deductible)       [phone] Phone Number: 01/01/2020, 10:02 AM
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
## 2020-01-01 NOTE — TOC Transition Note (Signed)
Transition of Care [facility]) - CM/SW Discharge Note   Patient Details  Name: CAYETANO MIKITA MRN: [id number] Date of Birth: 1970-05-08  Transition of [facility]) CM/SW Contact:  Pollie Friar, RN Phone Number: 01/01/2020, 3:15 PM   Clinical Narrative:    Pt is discharging home with outpatient therapy. CM met with the patient and he would like to attend at [facility] therapy. CM will fax the orders for therapy to [facility]. Cane to be delivered to the room per [employer]. Pt without a PCP. Pt interested in Dr Rex Kras in [place]. CM was able to obtain an appt and placed it on the AVS.  CM provided pt coupons for Brilinta.  Medications for home to be delivered to the room per TOC pharmavcy. Pt has supervision at home and transportation to home.    Final next level of care: OP Rehab Barriers to Discharge: No Barriers Identified   Patient Goals and CMS Choice   CMS Medicare.gov Compare Post Acute Care list provided to:: Patient Choice offered to / list presented to : Patient  Discharge Placement                       Discharge Plan and Services                DME Arranged: Kasandra Knudsen DME Agency: [employer] Date DME Agency Contacted: 01/01/20   Representative spoke with at DME Agency: Theola Sequin            Social Determinants of Health ([place]) Interventions     Readmission Risk Interventions No flowsheet data found.

## 2020-01-01 NOTE — Discharge Summary (Addendum)
Stroke Discharge Summary  Patient ID: [name]   MRN: [id number]      DOB: 02-24-1971  Date of Admission: 12/28/2019 Date of Discharge: 01/01/2020  Attending Physician:  Marvel Plan, MD, Stroke MD Consultant(s):   Zoila Shutter, MD ( cardiology ), Faith Rogue, MD (Physical Medicine & Rehabilitation)  Patient's PCP:  Patient, No Pcp Per    DISCHARGE DIAGNOSIS:  Principal Problem:   Basilar artery occlusion with cerebral infarction [facility]) s/p revascularization Active Problems:   LV (left ventricular) mural thrombus   Acute myocardial infarction [facility]), apex   Essential hypertension   Hyperlipidemia   Tobacco use disorder   Obesity   Alcohol abuse   Allergies as of 01/01/2020   No Known Allergies     Medication List    STOP taking these medications   Eliquis 5 MG Tabs tablet Generic drug: apixaban     TAKE these medications   atorvastatin 40 MG tablet Commonly known as: LIPITOR Take 1 tablet (40 mg total) by mouth daily at 6 PM.   enoxaparin 120 MG/0.8ML injection Commonly known as: LOVENOX Inject 0.8 mLs (120 mg total) into the skin every 12 (twelve) hours. Start taking on: January 02, 2020   folic acid 1 MG tablet Commonly known as: FOLVITE Take 1 mg by mouth daily.   hydrochlorothiazide 25 MG tablet Commonly known as: HYDRODIURIL Take 25 mg by mouth daily.   losartan 50 MG tablet Commonly known as: COZAAR Take 50 mg by mouth daily.   NIFEdipine 60 MG 24 hr tablet Commonly known as: ADALAT CC Take 60 mg by mouth daily.   omeprazole 20 MG capsule Commonly known as: PRILOSEC Take 20 mg by mouth daily.   ticagrelor 90 MG Tabs tablet Commonly known as: BRILINTA Take 1 tablet (90 mg total) by mouth 2 (two) times daily.   VITAMIN B12 PO Take 1 tablet by mouth daily.   warfarin 5 MG tablet Commonly known as: COUMADIN Take 1.5 tablets (7.5 mg total) by mouth daily at 6 PM. Dose to be adjusted by coumadin clinic following d/c       LABORATORY  STUDIES CBC    Component Value Date/Time   WBC 12.1 (H) 01/01/2020 0343   RBC 5.83 (H) 01/01/2020 0343   HGB 16.4 01/01/2020 0343   HCT 50.8 01/01/2020 0343   PLT 249 01/01/2020 0343   MCV 87.1 01/01/2020 0343   MCH 28.1 01/01/2020 0343   MCHC 32.3 01/01/2020 0343   RDW 14.5 01/01/2020 0343   LYMPHSABS 1.3 12/29/2019 0500   MONOABS 0.9 12/29/2019 0500   EOSABS 0.0 12/29/2019 0500   BASOSABS 0.0 12/29/2019 0500   CMP    Component Value Date/Time   NA 134 (L) 01/01/2020 0343   K 3.5 01/01/2020 0343   CL 98 01/01/2020 0343   CO2 23 01/01/2020 0343   GLUCOSE 119 (H) 01/01/2020 0343   BUN 17 01/01/2020 0343   CREATININE 1.13 01/01/2020 0343   CALCIUM 9.5 01/01/2020 0343   PROT 7.8 12/28/2019 1141   ALBUMIN 4.4 12/28/2019 1141   AST 25 12/28/2019 1141   ALT 29 12/28/2019 1141   ALKPHOS 95 12/28/2019 1141   BILITOT 1.6 (H) 12/28/2019 1141   GFRNONAA >60 01/01/2020 0343   GFRAA >60 12/31/2019 0053   COAGS Lab Results  Component Value Date   INR 1.1 01/01/2020   INR 1.0 12/31/2019   INR 1.0 12/28/2019   Lipid Panel    Component Value Date/Time  CHOL 163 12/30/2019 0938   TRIG 153 (H) 12/30/2019 0938   HDL 30 (L) 12/30/2019 0938   CHOLHDL 5.4 12/30/2019 0938   VLDL 31 12/30/2019 0938   LDLCALC 102 (H) 12/30/2019 0938   HgbA1C  Lab Results  Component Value Date   HGBA1C 6.0 (H) 12/29/2019   Urine Drug Screen     Component Value Date/Time   LABOPIA NONE DETECTED 12/29/2019 1610   COCAINSCRNUR NONE DETECTED 12/29/2019 1610   LABBENZ NONE DETECTED 12/29/2019 1610   AMPHETMU NONE DETECTED 12/29/2019 1610   THCU NONE DETECTED 12/29/2019 1610   LABBARB NONE DETECTED 12/29/2019 1610     SIGNIFICANT DIAGNOSTIC STUDIES CT HEAD WO CONTRAST  Result Date: 12/28/2019 CLINICAL DATA:  49 year old male code stroke presentation with basilar artery thrombosis status post endovascular treatment. Subsequent encounter. EXAM: CT HEAD WITHOUT CONTRAST TECHNIQUE: Contiguous  axial images were obtained from the base of the skull through the vertex without intravenous contrast. COMPARISON:  CT head 1144 hours today. CTA and CT Perfusion earlier today. FINDINGS: Brain: No acute intracranial hemorrhage identified. No midline shift, mass effect, or evidence of intracranial mass lesion. No ventriculomegaly. Stable since this morning and relatively mild patchy hypodensity in the right cerebellar PICA territory (series 3, image 29). Gray-white matter differentiation in the deep gray nuclei, the brainstem and the left cerebellar hemisphere appears stable and within normal limits. There are possible small bilateral occipital pole cortical infarcts (series 3, image 22). But elsewhere supratentorial gray-white matter differentiation is stable. Vascular: New distal left vertebral artery through proximal basilar vascular stent. Possible mild residual intravascular contrast. No suspicious intracranial vascular hyperdensity. Skull: No acute osseous abnormality identified. Sinuses/Orbits: Stable paranasal sinuses, maxillary sinus mucous retention cysts. Tympanic cavities and mastoids remain clear. Other: No acute orbit or scalp soft tissue finding. IMPRESSION: 1. New small bilateral Occipital pole cortical infarcts suspected. But stable CT appearance of mild Right PICA infarct since this morning, and no associated hemorrhage or mass effect. 2. And elsewhere stable CT appearance of the brain status post endovascular treatment of Basilar Artery thrombosis. 3. New vascular stent from the dominant distal left vertebral through the proximal basilar. Electronically Signed   By: Odessa Fleming M.D.   On: 12/28/2019 20:13   MR ANGIO HEAD WO CONTRAST  Result Date: 12/29/2019 CLINICAL DATA:  Stroke follow-up. EXAM: MRI HEAD WITHOUT CONTRAST MRA HEAD WITHOUT CONTRAST TECHNIQUE: Multiplanar, multiecho pulse sequences of the brain and surrounding structures were obtained without intravenous contrast. Angiographic  images of the head were obtained using MRA technique without contrast. COMPARISON:  None. FINDINGS: MRI HEAD FINDINGS Brain: Numerous acute infarcts throughout the posterior circulation, including bilateral thalami, bilateral occipital lobes, the cerebellum, and the brainstem. There are areas of associated edema in these regions without substantial mass effect. The fourth ventricle remains patent. No hydrocephalus. No midline shift. Basal cisterns are patent. No evidence of mass occupying hemorrhagic transformation. Skull and upper cervical spine: Normal marrow signal. Sinuses/Orbits: Retention cyst in bilateral maxillary sinuses. Ethmoid air cell mucosal thickening. Other: Small right mastoid effusion. MRA HEAD FINDINGS Interval placement of a basilar artery stent. There is flow related signal within the stent and distal to the sent, compatible with patency. No evidence of significant stenosis of the visualized V4 left vertebral artery. The non dominant right vertebral artery has poor flow related signal, which may relate to its diminutive size. No evidence of significant stenosis of the basilar artery distal to the stent or bilateral posterior cerebral arteries. No evidence of significant  stenosis in the anterior circulation. Small right A1 ACA with compensatory enlarged left A1 ACA without focal stenosis. No significant stenosis of the A2 ACAs. IMPRESSION: 1. Numerous acute infarcts throughout the posterior circulation, including bilateral thalami, bilateral occipital lobes, the cerebellum, and the brainstem. 2. Interval placement of a basilar artery stent. There is flow related signal within the stent and distal to the sent, compatible with patency. CTA could provide better evaluation of the stent if clinically indicated. Findings discussed with Dr. Roda Shutters at 12:59 PM via telephone. Electronically Signed   By: Feliberto Harts MD   On: 12/29/2019 13:01   MR BRAIN WO CONTRAST  Result Date: 12/29/2019 CLINICAL  DATA:  Stroke follow-up. EXAM: MRI HEAD WITHOUT CONTRAST MRA HEAD WITHOUT CONTRAST TECHNIQUE: Multiplanar, multiecho pulse sequences of the brain and surrounding structures were obtained without intravenous contrast. Angiographic images of the head were obtained using MRA technique without contrast. COMPARISON:  None. FINDINGS: MRI HEAD FINDINGS Brain: Numerous acute infarcts throughout the posterior circulation, including bilateral thalami, bilateral occipital lobes, the cerebellum, and the brainstem. There are areas of associated edema in these regions without substantial mass effect. The fourth ventricle remains patent. No hydrocephalus. No midline shift. Basal cisterns are patent. No evidence of mass occupying hemorrhagic transformation. Skull and upper cervical spine: Normal marrow signal. Sinuses/Orbits: Retention cyst in bilateral maxillary sinuses. Ethmoid air cell mucosal thickening. Other: Small right mastoid effusion. MRA HEAD FINDINGS Interval placement of a basilar artery stent. There is flow related signal within the stent and distal to the sent, compatible with patency. No evidence of significant stenosis of the visualized V4 left vertebral artery. The non dominant right vertebral artery has poor flow related signal, which may relate to its diminutive size. No evidence of significant stenosis of the basilar artery distal to the stent or bilateral posterior cerebral arteries. No evidence of significant stenosis in the anterior circulation. Small right A1 ACA with compensatory enlarged left A1 ACA without focal stenosis. No significant stenosis of the A2 ACAs. IMPRESSION: 1. Numerous acute infarcts throughout the posterior circulation, including bilateral thalami, bilateral occipital lobes, the cerebellum, and the brainstem. 2. Interval placement of a basilar artery stent. There is flow related signal within the stent and distal to the sent, compatible with patency. CTA could provide better evaluation  of the stent if clinically indicated. Findings discussed with Dr. Roda Shutters at 12:59 PM via telephone. Electronically Signed   By: Feliberto Harts MD   On: 12/29/2019 13:01   IR CT Head Ltd  Result Date: 01/01/2020 INDICATION: History of nausea, vomiting, blurred vision, decreased level of consciousness and progressive right-sided weakness. 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 EXAM: 1. EMERGENT LARGE VESSEL OCCLUSION THROMBOLYSIS (POSTERIOR CIRCULATION) COMPARISON:  CT angiogram of the head and neck of December 28, 2019. MEDICATIONS: Ancef 2 g IV was administered within 1 hour of the procedure. ANESTHESIA/SEDATION: General anesthesia CONTRAST:  Isovue 300 approximately 120 mL FLUOROSCOPY TIME:  Fluoroscopy Time: 15 minutes 0 seconds (3830 mGy). COMPLICATIONS: None immediate. TECHNIQUE: Following a full explanation of the procedure along with the potential associated complications, an informed witnessed consent was obtained from the patient's mother. The risks of intracranial hemorrhage of 10%, worsening neurological deficit, ventilator dependency, death and inability to revascularize were all reviewed in detail with the patient's mother. The patient was then put under general anesthesia by the Department of Anesthesiology at  [facility]. The right groin was prepped and draped in the usual sterile fashion. Thereafter using modified Seldinger technique, transfemoral access into the right common femoral artery was obtained without difficulty. Over a 0.035 inch guidewire an 8 [place] 25 cm [place] Pinnacle sheath was inserted. Through this, and also over a 0.035 inch guidewire a 5 [place] JB 1 catheter was advanced to the aortic arch region and selectively positioned in the left common carotid artery and the left vertebral artery. FINDINGS: The left common arteriogram demonstrates the  right external carotid artery and its major branches to be widely patent. The left internal carotid artery at bulb to the cranial skull base is widely patent. The petrous, cavernous and supraclinoid segments demonstrate patency. The left middle cerebral artery and the left anterior cerebral artery opacify into the capillary and venous phases. Prompt cross-filling via the anterior communicating artery of the right anterior cerebral A2 segment and distally is noted. The left vertebral artery origin is widely patent. The vessel demonstrates mild tortuosity proximally. More distally the vessel opacifies to the cranial skull base. Patency is maintained of the left vertebrobasilar junction and the left posterior-inferior cerebellar artery. The proximal basilar artery and extending into the mid basilar artery proximal to the artery of the anterior inferior cerebellar artery is an irregular filling defect with severe high-grade stenosis involving the posterior aspect more proximally. More focal areas of filling defects are seen extending into the distal mid basilar artery. Patency is seen of the right posterior-inferior cerebellar artery from the left vertebral artery injection. The distal basilar artery, the posterior cerebral arteries, the superior cerebellar arteries and the anterior-inferior cerebellar arteries opacify into the capillary and venous phases. PROCEDURE: The diagnostic JB 1 catheter in the distal left subclavian artery was exchanged over a 0.035 inch 300 cm Rosen exchange guidewire for an 8 [place] 80 cm Neuron Max guide sheath. The guidewire were removed. Good aspiration obtained from the hub of the Neuron Max sheath. This was then retrieved proximally to engage the origin of the left vertebral artery. Over a 0.014 inch standard Synchro micro guidewire with a J-tip configuration, a combination of an 021 Trevo microcatheter inside of an 071 Zoom aspiration catheter was advanced using biplane roadmap  technique and constant fluoroscopic guidance to the left vertebrobasilar junction. The micro guidewire was then gently advanced across the high-grade stenosis into the left posterior cerebral artery by the microcatheter. The guidewire was removed. Good aspiration obtained from the hub of the microcatheter in the left posterior cerebral artery. A 5 mm x 37 mm Embotrap retrieval device was then advanced to the distal end of the microcatheter. The O ring on the delivery microcatheter was loosened. With slight forward gentle traction with the right hand on the delivery micro guidewire, with the left hand the delivery microcatheter was retrieved unsheathing the retrieval device. The microcatheter was used to treat more proximally to the left vertebrobasilar junction. The 071 aspiration catheter was advanced such that it was engaged in the proximal aspect of the now occluded basilar artery. Constant aspiration was then applied at the hub of the aspiration catheter for approximately 2-1/2 minutes. Thereafter, the combination of the retrieval device, and the microcatheter was retrieved and removed. Specks of clot were seen engaged in the interstices of the retrieval device. Free aspiration was noted from the aspiration catheter which was maintained in the left vertebrobasilar junction. A control arteriogram performed through this demonstrated improved caliber and flow through the previous area of near complete occlusion.  More distally no evidence of intraluminal filling defects were seen. A control arteriogram performed 5 minutes later demonstrated recoiling with now progressive narrowing at the site of the severe stenosis secondary to an atherosclerotic plaque. Patient was loaded with 81 mg of aspirin, and 180 mg of Brilinta via an orogastric tube. An 021 microcatheter was then advanced over a 0.014 inch standard Synchro micro guidewire to the left PCA. The micro guidewire was removed. A Zoom 300 cm 014 inch exchange wire  was then positioned with the distal end of the J-tip in the right posterior cerebral artery P2 region. The microcatheter was retrieved and removed. At this time, the Zoom 071 aspiration catheter was replaced with a 5 French 115 cm Catalyst guide catheter which was advanced over the exchange micro guidewire to the left vertebrobasilar junction. Measurements were performed of the normal basilar artery immediately distal to the lesion, and proximally. A 4 mm x 22 mm Resolute Onyx stent was then prepped and purged with heparinized saline infusion retrogradely, and also with 75% contrast and 25% heparinized saline infusion. Using the rapid exchange technique, the delivery system with the stent was advanced and positioned adequate distant from the site of the previous angioplasty and the segmental atherosclerotic plaque. The stent was then deployed by inflating the balloon to 12 atmospheres using micro inflation syringe device and micro tubing. The balloon was deflated and retrieved and removed. Control arteriograms performed immediately and at 15 and 30 minutes post deployment of the stent demonstrated wide patency of the stent and of the posterior fossa branches without evidence of intraluminal filling defects. Prior to stent placement, a CT of the brain demonstrated no evidence of hemorrhage. Patient was given bolus dose of Cangrelor followed by a slow infusion for 4 hours. The 5 [place] Catalyst sheath was removed. A control arteriogram performed through the Neuron Max sheath at the origin of the left vertebral artery demonstrated wide patency of the left vertebral artery proximally and distally intracranially. Patency was seen of the posterior cerebral arteries, superior cerebellar arteries and the anterior-inferior cerebellar arteries into the capillary and venous phases. No evidence of intraluminal filling defects were seen within the stent itself. The Neuron Max sheath was removed. The 8 French Pinnacle sheath was  removed with placement of a 7 French Angio-Seal closure device in the right groin for hemostasis. Distal pulses remained Dopplerable in both feet unchanged. The patient's anesthesia was reversed and the patient was extubated without difficulty. Upon recovery, the patient moved all fours spontaneously with the right upper extremity being weak. Patient had difficulty seeing objects in the right visual field than the left. Tongue was in the midline. Patient obeyed simple commands appropriately. He was then transferred to the neuro ICU for post recanalization management. 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 PLAN: Follow-up in the clinic 4-6 weeks post discharge. Electronically Signed   By: Julieanne Cotton M.D.   On: 12/31/2019 10:31   CT CEREBRAL PERFUSION W CONTRAST  Addendum Date: 12/28/2019   ADDENDUM REPORT: 12/28/2019 12:29 ADDENDUM: CORRECTION. In the findings it mentions infarct in the posterior circulation in the perfusion section. This was a typo. There is no core infarct on perfusion, but there is a large area of penumbra. This  was clarified with Dr. Thomasena Edis at 12:28 PM via telephone. Electronically Signed   By: Feliberto Harts MD   On: 12/28/2019 12:29   Result Date: 12/28/2019 CLINICAL DATA:  Right-sided weakness, blurry vision, vomiting, headaches. EXAM: CT ANGIOGRAPHY HEAD AND NECK CT PERFUSION BRAIN TECHNIQUE: Multidetector CT imaging of the head and neck was performed using the standard protocol during bolus administration of intravenous contrast. Multiplanar CT image reconstructions and MIPs were obtained to evaluate the vascular anatomy. Carotid stenosis measurements (when applicable)  are obtained utilizing NASCET criteria, using the distal internal carotid diameter as the denominator. Multiphase CT imaging of the brain was performed following IV bolus contrast injection. Subsequent parametric perfusion maps were calculated using RAPID software. CONTRAST:  OMNIPAQUE IOHEXOL 350 MG/ML SOLN COMPARISON:  None. FINDINGS: CTA NECK FINDINGS Aortic arch: Imaged portion shows no evidence of aneurysm or dissection. No significant stenosis of the major arch vessel origins. Right carotid system: Mild narrowing of the proximal right ICA, likely related to atherosclerosis. No evidence of greater than 50% narrowing or occlusion. Left carotid system: No evidence of greater than 50% stenosis or occlusion. Vertebral arteries: Left dominant. No evidence of greater than 50% stenosis or occlusion. Possible tiny filling defect within the left V4 vertebral artery at the level of the PICA origin (series 7, image 146). The right vertebral artery is diminutive throughout its course. Skeleton: Mild multilevel degenerative changes of the cervical spine. Other neck: No mass or adenopathy. Unremarkable appearance of the thyroid gland. Upper chest: Paraseptal and centrilobular emphysema. No consolidation. Review of the MIP images confirms the above findings CTA HEAD FINDINGS Anterior circulation: No evidence of greater than 50% stenosis or occlusion. Bilateral M1 and proximal M2 MCA branches appear widely patent. Diminutive right A1 ACA, likely congenital variation given large left A1 ACA. Bilateral A2 ACA is are well opacified. No evidence of aneurysm. Posterior circulation: There is focal filling defect within the proximal to mid severe narrowing of the artery. Basilar artery, compatible with intraluminal thrombus which results in severe narrowing. Thrombus does not appear to extend to the superior cerebellar or posterior cerebral origins. No evidence of greater than 50% narrowing of the posterior cerebral arteries.  No evidence of aneurysm. Venous sinuses: Limited visualization given contrast bolus timing. Review of the MIP images confirms the above findings CT Brain Perfusion Findings: CBF (<30%) Volume: 0 mLmL Perfusion (Tmax>6.0s) volume: Mismatch Volume: Infarction Location:Posterior circulation, including cerebellum and occipital lobes. IMPRESSION: 1. 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 Possible small amount of thrombus in the left V4 vertebral artery. 2. Perfusion imaging demonstrates a large area of penumbra involving the posterior circulation without core infarct. Code stroke imaging results were communicated on 12/28/2019 at 12:01 p.m. to provider Dr. Thomasena Edis via telephone, who verbally acknowledged these results. Electronically Signed: By: Feliberto Harts MD On: 12/28/2019 12:17   ECHOCARDIOGRAM COMPLETE  Result Date: 12/29/2019    ECHOCARDIOGRAM REPORT   Patient Name:   [name] Date of Exam: 12/29/2019 Medical Rec #:  [id number]    Height: Accession #:    [id number]   Weight:       265.0 lb Date of Birth:  04-Oct-1970    BSA:          2.436 m Patient Age:    49 years     BP:           131/78 mmHg Patient Gender: M  HR:           79 bpm. Exam Location:  Inpatient Procedure: 2D Echo, Cardiac Doppler, Color Doppler and Intracardiac            Opacification Agent Indications:    Stroke 434.91/II63.9  History:        Patient has no prior history of Echocardiogram examinations.  Sonographer:    Roosvelt Maser RDCS Referring Phys: [id number] HUNTER J COLLINS IMPRESSIONS  1. 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 1 x 1.8 cm apical density/filling defect on contrast imaging that is semi mobile consistent with apical thrombus. . Left ventricular ejection fraction, by estimation, is 55 to 60%. The left ventricle has normal function. The left ventricle demonstrates regional wall motion abnormalities (see scoring diagram/findings for  description). There is severe left ventricular hypertrophy. Left ventricular diastolic parameters are consistent with Grade I diastolic dysfunction (impaired  relaxation). Elevated left atrial pressure. There is akinesis of the left ventricular, apical segment.  2. Right ventricular systolic function is normal. The right ventricular size is normal.  3. The mitral valve is normal in structure. No evidence of mitral valve regurgitation. No evidence of mitral stenosis.  4. The aortic valve is tricuspid. Aortic valve regurgitation is not visualized. No aortic stenosis is present.  5. Apical thrombus reported to Dr Roda Shutters through epic direct messaging. FINDINGS  Left Ventricle: The apex is akinetic. 1 x 1.8 cm apical density/filling defect on contrast imaging that is semi mobile consistent with apical thrombus. Left ventricular ejection fraction, by estimation, is 55 to 60%. The left ventricle has normal function. The left ventricle demonstrates regional wall motion abnormalities. Definity contrast agent was given IV to delineate the left ventricular endocardial borders. The left ventricular internal cavity size was normal in size. There is severe left ventricular hypertrophy. Left ventricular diastolic parameters are consistent with Grade I diastolic dysfunction (impaired relaxation). Elevated left atrial pressure. Right Ventricle: The right ventricular size is normal. No increase in right ventricular wall thickness. Right ventricular systolic function is normal. Left Atrium: Left atrial size was normal in size. Right Atrium: Right atrial size was normal in size. Pericardium: There is no evidence of pericardial effusion. Mitral Valve: The mitral valve is normal in structure. No evidence of mitral valve regurgitation. No evidence of mitral valve stenosis. Tricuspid Valve: The tricuspid valve is normal in structure. Tricuspid valve regurgitation is not demonstrated. No evidence of tricuspid stenosis. Aortic Valve: The aortic  valve is tricuspid. Aortic valve regurgitation is not visualized. No aortic stenosis is present. Aortic valve mean gradient measures 5.1 mmHg. Aortic valve peak gradient measures 9.7 mmHg. Aortic valve area, by VTI measures 3.87 cm. Pulmonic Valve: The pulmonic valve was not well visualized. Pulmonic valve regurgitation is not visualized. No evidence of pulmonic stenosis. Aorta: The aortic root is normal in size and structure. Pulmonary Artery: Indeterminant PASP, inadequate TR jet. IAS/Shunts: No atrial level shunt detected by color flow Doppler.  LEFT VENTRICLE PLAX 2D LVIDd:         5.10 cm      Diastology LVIDs:         3.40 cm      LV e' medial:    4.35 cm/s LV PW:         1.50 cm      LV E/e' medial:  17.8 LV IVS:        1.50 cm      LV e' lateral:   4.46 cm/s LVOT diam:     2.50 cm  LV E/e' lateral: 17.4 LV SV:         107 LV SV Index:   44 LVOT Area:     4.91 cm  LV Volumes (MOD) LV vol d, MOD A2C: 146.0 ml LV vol d, MOD A4C: 58.0 ml LV vol s, MOD A2C: 30.6 ml LV vol s, MOD A4C: 23.7 ml LV SV MOD A2C:     115.4 ml LV SV MOD A4C:     58.0 ml LV SV MOD BP:      65.5 ml RIGHT VENTRICLE RV Basal diam:  3.90 cm LEFT ATRIUM              Index       RIGHT ATRIUM           Index LA diam:        4.70 cm  1.93 cm/m  RA Area:     26.80 cm LA Vol (A2C):   120.0 ml 49.26 ml/m RA Volume:   85.80 ml  35.22 ml/m LA Vol (A4C):   88.2 ml  36.21 ml/m LA Biplane Vol: 108.0 ml 44.33 ml/m  AORTIC VALVE AV Area (Vmax):    3.81 cm AV Area (Vmean):   3.69 cm AV Area (VTI):     3.87 cm AV Vmax:           156.10 cm/s AV Vmean:          105.627 cm/s AV VTI:            0.275 m AV Peak Grad:      9.7 mmHg AV Mean Grad:      5.1 mmHg LVOT Vmax:         121.27 cm/s LVOT Vmean:        79.349 cm/s LVOT VTI:          0.217 m LVOT/AV VTI ratio: 0.79  AORTA Ao Root diam: 3.60 cm Ao Asc diam:  3.60 cm MITRAL VALVE MV Area (PHT): 4.06 cm     SHUNTS MV Decel Time: 187 msec     Systemic VTI:  0.22 m MV E velocity: 77.60 cm/s    Systemic Diam: 2.50 cm MV A velocity: 109.00 cm/s MV E/A ratio:  0.71 Dina Rich MD Electronically signed by Dina Rich MD Signature Date/Time: 12/29/2019/10:23:45 AM    Final    IR PERCUTANEOUS ART THROMBECTOMY/INFUSION INTRACRANIAL INC DIAG ANGIO  Result Date: 01/01/2020 INDICATION: History of nausea, vomiting, blurred vision, decreased level of consciousness and progressive right-sided weakness. 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 EXAM: 1. EMERGENT LARGE VESSEL OCCLUSION THROMBOLYSIS (POSTERIOR CIRCULATION) COMPARISON:  CT angiogram of the head and neck of December 28, 2019. MEDICATIONS: Ancef 2 g IV was administered within 1 hour of the procedure. ANESTHESIA/SEDATION: General anesthesia CONTRAST:  Isovue 300 approximately 120 mL FLUOROSCOPY TIME:  Fluoroscopy Time: 15 minutes 0 seconds (3830 mGy). COMPLICATIONS: None immediate. TECHNIQUE: Following a full explanation of the procedure along with the potential associated complications, an informed witnessed consent was obtained from the patient's mother. The risks of intracranial hemorrhage of 10%, worsening neurological deficit, ventilator dependency, death and inability to revascularize were all reviewed in detail with the patient's mother. The patient was then put under general anesthesia by the Department of Anesthesiology at [facility]. The right groin was prepped and draped in the usual sterile fashion. Thereafter using modified Seldinger technique,  transfemoral access into the right common femoral artery was obtained without difficulty. Over a 0.035 inch guidewire an 8 [place] 25 cm [place] Pinnacle sheath was inserted. Through this, and also over a 0.035 inch guidewire a 5 [place] JB 1 catheter was advanced to the aortic arch region and selectively positioned in the left common carotid artery and the left  vertebral artery. FINDINGS: The left common arteriogram demonstrates the right external carotid artery and its major branches to be widely patent. The left internal carotid artery at bulb to the cranial skull base is widely patent. The petrous, cavernous and supraclinoid segments demonstrate patency. The left middle cerebral artery and the left anterior cerebral artery opacify into the capillary and venous phases. Prompt cross-filling via the anterior communicating artery of the right anterior cerebral A2 segment and distally is noted. The left vertebral artery origin is widely patent. The vessel demonstrates mild tortuosity proximally. More distally the vessel opacifies to the cranial skull base. Patency is maintained of the left vertebrobasilar junction and the left posterior-inferior cerebellar artery. The proximal basilar artery and extending into the mid basilar artery proximal to the artery of the anterior inferior cerebellar artery is an irregular filling defect with severe high-grade stenosis involving the posterior aspect more proximally. More focal areas of filling defects are seen extending into the distal mid basilar artery. Patency is seen of the right posterior-inferior cerebellar artery from the left vertebral artery injection. The distal basilar artery, the posterior cerebral arteries, the superior cerebellar arteries and the anterior-inferior cerebellar arteries opacify into the capillary and venous phases. PROCEDURE: The diagnostic JB 1 catheter in the distal left subclavian artery was exchanged over a 0.035 inch 300 cm Rosen exchange guidewire for an 8 [place] 80 cm Neuron Max guide sheath. The guidewire were removed. Good aspiration obtained from the hub of the Neuron Max sheath. This was then retrieved proximally to engage the origin of the left vertebral artery. Over a 0.014 inch standard Synchro micro guidewire with a J-tip configuration, a combination of an 021 Trevo microcatheter inside of an  071 Zoom aspiration catheter was advanced using biplane roadmap technique and constant fluoroscopic guidance to the left vertebrobasilar junction. The micro guidewire was then gently advanced across the high-grade stenosis into the left posterior cerebral artery by the microcatheter. The guidewire was removed. Good aspiration obtained from the hub of the microcatheter in the left posterior cerebral artery. A 5 mm x 37 mm Embotrap retrieval device was then advanced to the distal end of the microcatheter. The O ring on the delivery microcatheter was loosened. With slight forward gentle traction with the right hand on the delivery micro guidewire, with the left hand the delivery microcatheter was retrieved unsheathing the retrieval device. The microcatheter was used to treat more proximally to the left vertebrobasilar junction. The 071 aspiration catheter was advanced such that it was engaged in the proximal aspect of the now occluded basilar artery. Constant aspiration was then applied at the hub of the aspiration catheter for approximately 2-1/2 minutes. Thereafter, the combination of the retrieval device, and the microcatheter was retrieved and removed. Specks of clot were seen engaged in the interstices of the retrieval device. Free aspiration was noted from the aspiration catheter which was maintained in the left vertebrobasilar junction. A control arteriogram performed through this demonstrated improved caliber and flow through the previous area of near complete occlusion. More distally no evidence of intraluminal filling defects were seen. A control arteriogram performed 5 minutes later demonstrated recoiling with  now progressive narrowing at the site of the severe stenosis secondary to an atherosclerotic plaque. Patient was loaded with 81 mg of aspirin, and 180 mg of Brilinta via an orogastric tube. An 021 microcatheter was then advanced over a 0.014 inch standard Synchro micro guidewire to the left PCA. The  micro guidewire was removed. A Zoom 300 cm 014 inch exchange wire was then positioned with the distal end of the J-tip in the right posterior cerebral artery P2 region. The microcatheter was retrieved and removed. At this time, the Zoom 071 aspiration catheter was replaced with a 5 French 115 cm Catalyst guide catheter which was advanced over the exchange micro guidewire to the left vertebrobasilar junction. Measurements were performed of the normal basilar artery immediately distal to the lesion, and proximally. A 4 mm x 22 mm Resolute Onyx stent was then prepped and purged with heparinized saline infusion retrogradely, and also with 75% contrast and 25% heparinized saline infusion. Using the rapid exchange technique, the delivery system with the stent was advanced and positioned adequate distant from the site of the previous angioplasty and the segmental atherosclerotic plaque. The stent was then deployed by inflating the balloon to 12 atmospheres using micro inflation syringe device and micro tubing. The balloon was deflated and retrieved and removed. Control arteriograms performed immediately and at 15 and 30 minutes post deployment of the stent demonstrated wide patency of the stent and of the posterior fossa branches without evidence of intraluminal filling defects. Prior to stent placement, a CT of the brain demonstrated no evidence of hemorrhage. Patient was given bolus dose of Cangrelor followed by a slow infusion for 4 hours. The 5 [place] Catalyst sheath was removed. A control arteriogram performed through the Neuron Max sheath at the origin of the left vertebral artery demonstrated wide patency of the left vertebral artery proximally and distally intracranially. Patency was seen of the posterior cerebral arteries, superior cerebellar arteries and the anterior-inferior cerebellar arteries into the capillary and venous phases. No evidence of intraluminal filling defects were seen within the stent itself.  The Neuron Max sheath was removed. The 8 French Pinnacle sheath was removed with placement of a 7 French Angio-Seal closure device in the right groin for hemostasis. Distal pulses remained Dopplerable in both feet unchanged. The patient's anesthesia was reversed and the patient was extubated without difficulty. Upon recovery, the patient moved all fours spontaneously with the right upper extremity being weak. Patient had difficulty seeing objects in the right visual field than the left. Tongue was in the midline. Patient obeyed simple commands appropriately. He was then transferred to the neuro ICU for post recanalization management. 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 PLAN: Follow-up in the clinic 4-6 weeks post discharge. Electronically Signed   By: Julieanne Cotton M.D.   On: 12/31/2019 10:31   CT HEAD CODE STROKE WO CONTRAST  Result Date: 12/28/2019 CLINICAL DATA:  Code stroke. Dizziness, headache, right-sided weakness. EXAM: CT HEAD WITHOUT CONTRAST TECHNIQUE: Contiguous axial images were obtained from the base of the skull through the vertex without intravenous contrast. COMPARISON:  None. FINDINGS: Brain: No acute hemorrhage. Query linear area of hypoattenuation in the right cerebellum (series 3, image 7), possibly acute or subacute infarct. No hydrocephalus. No  mass lesion. 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 Vascular: Mild apparent hyperdensity of the basilar artery, which may relate to streak artifact, but warrants follow-up on forthcoming CTA. Calcific atherosclerosis. Skull: No acute fracture. Sinuses/Orbits: Right maxillary sinus retention cyst.  Remote right medial orbital wall fracture. Other: No mastoid effusions. ASPECTS [facility] Stroke Program Early CT Score) Total score (0-10 with 10 being normal): 10 IMPRESSION: 1. Possible acute/subacute right cerebellar infarct. MRI could further evaluate if clinically indicated. 2. Mild apparent hyperdensity of the basilar artery, which may relate to streak artifact, but warrants follow-up on forthcoming CTA. 3. Otherwise, no evidence of acute large vascular territory infarct or acute hemorrhage. 4. ASPECTS is 10 Code stroke imaging results were communicated on 12/28/2019 at 11:55 am to provider Dr. Thomasena Edis via telephone, who verbally acknowledged these results. Electronically Signed   By: Feliberto Harts MD   On: 12/28/2019 11:59   CT ANGIO HEAD CODE STROKE  Addendum Date: 12/28/2019   ADDENDUM REPORT: 12/28/2019 12:29 ADDENDUM: CORRECTION. In the findings it mentions infarct in the posterior circulation in the perfusion section. This was a typo. There is no core infarct on perfusion, but there is a large area of penumbra. This was clarified with Dr. Thomasena Edis at 12:28 PM via telephone. Electronically Signed   By: Feliberto Harts MD   On: 12/28/2019 12:29   Result Date: 12/28/2019 CLINICAL DATA:  Right-sided weakness, blurry vision, vomiting, headaches. EXAM: CT ANGIOGRAPHY HEAD AND NECK CT PERFUSION BRAIN TECHNIQUE: Multidetector CT imaging of the head and neck was performed using the standard protocol during bolus administration of intravenous contrast. Multiplanar CT image reconstructions and MIPs were obtained to evaluate the vascular anatomy. Carotid stenosis measurements (when applicable) are obtained utilizing NASCET criteria, using the distal internal carotid diameter as the denominator. Multiphase CT imaging of the brain was performed following IV bolus contrast injection. Subsequent parametric perfusion maps were calculated using RAPID software. CONTRAST:  OMNIPAQUE IOHEXOL 350 MG/ML SOLN  COMPARISON:  None. FINDINGS: CTA NECK FINDINGS Aortic arch: Imaged portion shows no evidence of aneurysm or dissection. No significant stenosis of the major arch vessel origins. Right carotid system: Mild narrowing of the proximal right ICA, likely related to atherosclerosis. No evidence of greater than 50% narrowing or occlusion. Left carotid system: No evidence of greater than 50% stenosis or occlusion. Vertebral arteries: Left dominant. No evidence of greater than 50% stenosis or occlusion. Possible tiny filling defect within the left V4 vertebral artery at the level of the PICA origin (series 7, image 146). The right vertebral artery is diminutive throughout its course. Skeleton: Mild multilevel degenerative changes of the cervical spine. Other neck: No mass or adenopathy. Unremarkable appearance of the thyroid gland. Upper chest: Paraseptal and centrilobular emphysema. No consolidation. Review of the MIP images confirms the above findings CTA HEAD FINDINGS Anterior circulation: No evidence of greater than 50% stenosis or occlusion. Bilateral M1 and proximal M2 MCA branches appear widely patent. Diminutive right A1 ACA, likely congenital variation given large left A1 ACA. Bilateral A2 ACA is are well opacified. No evidence of aneurysm. Posterior circulation: There is focal filling defect within the proximal to mid severe narrowing of the artery. Basilar artery, compatible with intraluminal thrombus which results in severe narrowing. Thrombus does not appear to extend to the superior cerebellar or posterior cerebral origins. No evidence of greater than 50% narrowing of the posterior cerebral arteries. No evidence of aneurysm. Venous sinuses: Limited visualization given contrast bolus timing. Review of  the MIP images confirms the above findings CT Brain Perfusion Findings: CBF (<30%) Volume: 0 mLmL Perfusion (Tmax>6.0s) volume: Mismatch Volume: Infarction Location:Posterior circulation, including  cerebellum and occipital lobes. IMPRESSION: 1. 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 Possible small amount of thrombus in the left V4 vertebral artery. 2. Perfusion imaging demonstrates a large area of penumbra involving the posterior circulation without core infarct. Code stroke imaging results were communicated on 12/28/2019 at 12:01 p.m. to provider Dr. Thomasena Edis via telephone, who verbally acknowledged these results. Electronically Signed: By: Feliberto Harts MD On: 12/28/2019 12:17   CT ANGIO NECK CODE STROKE  Result Date: 12/28/2019 CLINICAL DATA:  Right-sided weakness, blurred vision, vomiting, headaches. EXAM: CT ANGIOGRAPHY HEAD AND NECK TECHNIQUE: Multidetector CT imaging of the head and neck was performed using the standard protocol during bolus administration of intravenous contrast. Multiplanar CT image reconstructions and MIPs were obtained to evaluate the vascular anatomy. Carotid stenosis measurements (when applicable) are obtained utilizing NASCET criteria, using the distal internal carotid diameter as the denominator. CONTRAST:  OMNIPAQUE IOHEXOL 350 MG/ML SOLN COMPARISON:  None. FINDINGS: CTA NECK FINDINGS Aortic arch: Imaged portion shows no evidence of aneurysm or dissection. No significant stenosis of the major arch vessel origins. Right carotid system: Mild narrowing of the proximal right ICA, likely related to atherosclerosis. No evidence of greater than 50% narrowing or occlusion. Left carotid system: No evidence of greater than 50% stenosis or occlusion. Vertebral arteries: Left dominant. No evidence of greater than 50% stenosis or occlusion. Possible tiny filling defect within the left V4 vertebral artery at the level of the PICA origin (series 7, image 146). The right vertebral artery is diminutive throughout its course. Skeleton: Mild multilevel degenerative changes of the cervical spine. Other  neck: No mass or adenopathy. Unremarkable appearance of the thyroid gland. Upper chest: Paraseptal and centrilobular emphysema. No consolidation. Review of the MIP images confirms the above findings CTA HEAD FINDINGS Anterior circulation: No evidence of greater than 50% stenosis or occlusion. Bilateral M1 and proximal M2 MCA branches appear widely patent. Diminutive right A1 ACA, likely congenital variation given large left A1 ACA. Bilateral A2 ACA is are well opacified. No evidence of aneurysm. Posterior circulation: There is focal filling defect within the proximal to mid severe narrowing of the artery. Basilar artery, compatible with intraluminal thrombus which results in severe narrowing. Thrombus does not appear to extend to the superior cerebellar or posterior cerebral origins. No evidence of greater than 50% narrowing of the posterior cerebral arteries. No evidence of aneurysm. Venous sinuses: Limited visualization given contrast bolus timing. Review of the MIP images confirms the above findings CT Brain Perfusion Findings: CBF (<30%) Volume: 0 mLmL Perfusion (Tmax>6.0s) volume: Mismatch Volume: Penumbra: Posterior circulation, including cerebellum and occipital lobes. IMPRESSION: 1. 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 Possible small amount of thrombus in the left V4 vertebral artery. 2. Perfusion imaging demonstrates a large area of penumbra involving the posterior circulation without core infarct. Code stroke imaging results were communicated on 12/28/2019 at 12: 01 p.m. to provider Dr. Thomasena Edis via telephone, who verbally acknowledged these results. Electronically Signed By: Feliberto Harts MD On: 12/28/2019 12:17 Electronically Signed   By: Feliberto Harts MD   On: 12/28/2019 12:27   IR ANGIO EXTRACRAN SEL COM CAROTID INNOMINATE UNI L MOD SED  Result Date: 01/01/2020 INDICATION: History of nausea,  vomiting, blurred vision, decreased level of consciousness and progressive right-sided weakness. 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 EXAM: 1. EMERGENT LARGE VESSEL OCCLUSION THROMBOLYSIS (POSTERIOR CIRCULATION) COMPARISON:  CT angiogram of the head and neck of December 28, 2019. MEDICATIONS: Ancef 2 g IV was administered within 1 hour of the procedure. ANESTHESIA/SEDATION: General anesthesia CONTRAST:  Isovue 300 approximately 120 mL FLUOROSCOPY TIME:  Fluoroscopy Time: 15 minutes 0 seconds (3830 mGy). COMPLICATIONS: None immediate. TECHNIQUE: Following a full explanation of the procedure along with the potential associated complications, an informed witnessed consent was obtained from the patient's mother. The risks of intracranial hemorrhage of 10%, worsening neurological deficit, ventilator dependency, death and inability to revascularize were all reviewed in detail with the patient's mother. The patient was then put under general anesthesia by the Department of Anesthesiology at [facility]. The right groin was prepped and draped in the usual sterile fashion. Thereafter using modified Seldinger technique, transfemoral access into the right common femoral artery was obtained without difficulty. Over a 0.035 inch guidewire an 8 [place] 25 cm [place] Pinnacle sheath was inserted. Through this, and also over a 0.035 inch guidewire a 5 [place] JB 1 catheter was advanced to the aortic arch region and selectively positioned in the left common carotid artery and the left vertebral artery. FINDINGS: The left common arteriogram demonstrates the right external carotid artery and its major branches to be widely patent. The left internal carotid artery at bulb to the cranial skull base is widely patent. The petrous, cavernous and supraclinoid segments demonstrate patency. The left middle  cerebral artery and the left anterior cerebral artery opacify into the capillary and venous phases. Prompt cross-filling via the anterior communicating artery of the right anterior cerebral A2 segment and distally is noted. The left vertebral artery origin is widely patent. The vessel demonstrates mild tortuosity proximally. More distally the vessel opacifies to the cranial skull base. Patency is maintained of the left vertebrobasilar junction and the left posterior-inferior cerebellar artery. The proximal basilar artery and extending into the mid basilar artery proximal to the artery of the anterior inferior cerebellar artery is an irregular filling defect with severe high-grade stenosis involving the posterior aspect more proximally. More focal areas of filling defects are seen extending into the distal mid basilar artery. Patency is seen of the right posterior-inferior cerebellar artery from the left vertebral artery injection. The distal basilar artery, the posterior cerebral arteries, the superior cerebellar arteries and the anterior-inferior cerebellar arteries opacify into the capillary and venous phases. PROCEDURE: The diagnostic JB 1 catheter in the distal left subclavian artery was exchanged over a 0.035 inch 300 cm Rosen exchange guidewire for an 8 [place] 80 cm Neuron Max guide sheath. The guidewire were removed. Good aspiration obtained from the hub of the Neuron Max sheath. This was then retrieved proximally to engage the origin of the left vertebral artery. Over a 0.014 inch standard Synchro micro guidewire with a J-tip configuration, a combination of an 021 Trevo microcatheter inside of an 071 Zoom aspiration catheter was advanced using biplane roadmap technique and constant fluoroscopic guidance to the left vertebrobasilar junction. The micro guidewire was then gently advanced across the high-grade stenosis into the left posterior cerebral artery by the microcatheter. The guidewire was removed. Good  aspiration obtained from the hub of the microcatheter in the left posterior cerebral artery. A 5 mm x 37 mm  Embotrap retrieval device was then advanced to the distal end of the microcatheter. The O ring on the delivery microcatheter was loosened. With slight forward gentle traction with the right hand on the delivery micro guidewire, with the left hand the delivery microcatheter was retrieved unsheathing the retrieval device. The microcatheter was used to treat more proximally to the left vertebrobasilar junction. The 071 aspiration catheter was advanced such that it was engaged in the proximal aspect of the now occluded basilar artery. Constant aspiration was then applied at the hub of the aspiration catheter for approximately 2-1/2 minutes. Thereafter, the combination of the retrieval device, and the microcatheter was retrieved and removed. Specks of clot were seen engaged in the interstices of the retrieval device. Free aspiration was noted from the aspiration catheter which was maintained in the left vertebrobasilar junction. A control arteriogram performed through this demonstrated improved caliber and flow through the previous area of near complete occlusion. More distally no evidence of intraluminal filling defects were seen. A control arteriogram performed 5 minutes later demonstrated recoiling with now progressive narrowing at the site of the severe stenosis secondary to an atherosclerotic plaque. Patient was loaded with 81 mg of aspirin, and 180 mg of Brilinta via an orogastric tube. An 021 microcatheter was then advanced over a 0.014 inch standard Synchro micro guidewire to the left PCA. The micro guidewire was removed. A Zoom 300 cm 014 inch exchange wire was then positioned with the distal end of the J-tip in the right posterior cerebral artery P2 region. The microcatheter was retrieved and removed. At this time, the Zoom 071 aspiration catheter was replaced with a 5 French 115 cm Catalyst guide  catheter which was advanced over the exchange micro guidewire to the left vertebrobasilar junction. Measurements were performed of the normal basilar artery immediately distal to the lesion, and proximally. A 4 mm x 22 mm Resolute Onyx stent was then prepped and purged with heparinized saline infusion retrogradely, and also with 75% contrast and 25% heparinized saline infusion. Using the rapid exchange technique, the delivery system with the stent was advanced and positioned adequate distant from the site of the previous angioplasty and the segmental atherosclerotic plaque. The stent was then deployed by inflating the balloon to 12 atmospheres using micro inflation syringe device and micro tubing. The balloon was deflated and retrieved and removed. Control arteriograms performed immediately and at 15 and 30 minutes post deployment of the stent demonstrated wide patency of the stent and of the posterior fossa branches without evidence of intraluminal filling defects. Prior to stent placement, a CT of the brain demonstrated no evidence of hemorrhage. Patient was given bolus dose of Cangrelor followed by a slow infusion for 4 hours. The 5 [place] Catalyst sheath was removed. A control arteriogram performed through the Neuron Max sheath at the origin of the left vertebral artery demonstrated wide patency of the left vertebral artery proximally and distally intracranially. Patency was seen of the posterior cerebral arteries, superior cerebellar arteries and the anterior-inferior cerebellar arteries into the capillary and venous phases. No evidence of intraluminal filling defects were seen within the stent itself. The Neuron Max sheath was removed. The 8 French Pinnacle sheath was removed with placement of a 7 French Angio-Seal closure device in the right groin for hemostasis. Distal pulses remained Dopplerable in both feet unchanged. The patient's anesthesia was reversed and the patient was extubated without difficulty.  Upon recovery, the patient moved all fours spontaneously with the right upper extremity being weak. Patient had  difficulty seeing objects in the right visual field than the left. Tongue was in the midline. Patient obeyed simple commands appropriately. He was then transferred to the neuro ICU for post recanalization management. 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 PLAN: Follow-up in the clinic 4-6 weeks post discharge. Electronically Signed   By: Julieanne Cotton M.D.   On: 12/31/2019 10:31      HISTORY OF PRESENT ILLNESS [name] is a 49 y.o. male states that when he went to bed last night he was feeling nauseated and thought he was coming down with a "stomach bug" however, he woke feeling dizzy and nauseated. He then noticed speech difficulty, right-sided weakness and facial asymmetry and EMS was called. He was diagnosed with multiple venous occlusions and was started on Eliquis but stopped taking about 2 days ago. No fever, chills, headache, hearing loss. He was LKW about 12 hours ago per admitted MD. tPA was not given as he was outside the window. Found to have a basilar artery occlusion and sent to IR for Thrombectomy. Modified Rankin Scale: 1. NIHSS: 4.   HOSPITAL COURSE [name] is a 49 y.o. male with history of multiple venous occlusions and was started onEliquisbut stopped taking it about 2 days ago. He presented to [facility] ED after he woke feeling dizzy and nauseated. He then noticedspeech difficulty, right-sided weakness and facial asymmetry.  He did not receive IV t-PA due to late presentation (>4.5 hours from time of onset). IR - Near occlusion of mid to prox basilar artery due  to thrombosis and underlying ICAD. S/P complete revascularization withx1 pass with 5mm x 37 mm embotrap retriever and aspiration followed by stent assisted angioplasty. Achieving a TICI 3 revascularization.  Stroke: posterior infarcts with BA thrombus s/p IR and stenting with TICI3, embolic, due to LV thrombus.  CT Head - Possible acute/subacute right cerebellar infarct    CTA H&N - intraluminal thrombus within the proximal to mid basilar artery with resulting severe narrowing. Possible small amount of thrombus in the left V4 vertebral artery.   CTP - positive penumbra at posterior circulation   IR - stent assisted angioplasty basilar artery with TICI3  CT head - New small bilateral Occipital pole cortical infarcts suspected. But stable CT appearance of mild Right PICA infarct since this morning, and no associated hemorrhage or mass effect.   MRI head - Numerous acute infarcts throughout the posterior circulation, including bilateral thalami, bilateral occipital lobes, the cerebellum, and the brainstem.  MRA head - patent BA stent  2D Echo - EF 55-60% but LV thrombus with apex akinetic   Sars Corona Virus 2 - negative  LDL - 102  HgbA1c - 6.0  UDS - neg  Had been on Eliquis but stopped taking it 2 days prior to admission, in hospital treated w/ aspirin 81 mg daily and Brilinta (ticagrelor) 90 mg bid as well as coumadin bridging with heparin IV. At discharge, changed to lovenox bridge for warfarin, will continue Brilinta and stop aspirin  Patient will counseled to be compliant with his antithrombotic medications  Therapy recommendations:  CIR->progressed in hospital, so no longer a candidate. For OP PT, OT & SLP  Disposition:  return home w/ OP therapies  LV thrombus with likely apex MI  Per Dr. Allena Katz @ [facility], pt was found to have LV thrombus on CT chest back in 10/2019 and was sent to cardiology, was put on eliquis  This admission TTE showed LV thrombus with apex  akinetic   CTA showed intraluminal thrombus within the proximal to mid basilar artery with resulting severe narrowing.  S/p IR with BA stenting and TICI3 revasc  On heparin IV per stroke protocol  Cardiology consulted - outpt card follow up for ischemic work up - appt 10/7  Embolism likely due to LV thrombus  08/2019 CT and 09/2019 MRI abdomen found to have splenic A, portal V, splenic V and proximal SMV thrombosis  Followed with Dr. Allena Katz in [place], put on eliquis 5 bid  Discussed with Dr. Carlyle Lipa @ [facility] - He stated that pt recent hypercoagulable lab all negative except homocysteine slightly elevated at 17.1   Missed eliquis for 2-3 days PTA  This admission LV thrombus with BA thrombus  On coumadin bridging with heparin IV  Homocysteine repeat normal range = 13.4  Continue to follow up with Dr. Allena Katz as outpt  Hypertension  Home BP meds: Losartan ; HCTZ ; Nifedipiine  Resume home meds  Treated w/ Cleviprex   Stable now  BP goal < 160 in hospital  Long-term BP goal normotensive  Hyperlipidemia  Home Lipid lowering medication: none   LDL 102, goal < 70  Now on lipitor 40   Continue statin at discharge  Tobacco abuse  Current smoker  Smoking cessation counseling provided  Pt is willing to quit  Other Stroke Risk Factors  Obesity, Body mass index is 36.96 kg/m., recommend weight loss, diet and exercise as appropriate   Alcohol use - ETH 378, recommend no more than 2 drinks per day  Other Active Problems  Leukocytosis - resolved   DISCHARGE EXAM Blood pressure 125/82, pulse 88, temperature 98.1 F (36.7 C), resp. rate 18, height 5\' 11"  (1.803 m), weight 120.2 kg, SpO2 98 %. General - Well nourished, well developed, in no apparent distress.  Ophthalmologic - fundi not visualized due to noncooperation.  Cardiovascular - Regular rhythm and rate.  Neuro - awake alert, orientated x 4. No aphasia, no significant  dysarthria, following all commands. Able to name and repeat. Visual field full, PERRL. However, left gaze palsy with nystagmus in both eyes. Right gaze right eye incomplete. Downward gaze rotational nystagmus. Facial symmetrical. Tongue midline. BUE and BLE equal strength, no ataxia. Sensation subjectively symmetrical. Gait not tested.   Discharge Diet   Heart healthy thin liquids  DISCHARGE PLAN  Disposition:  Return home  Outpatient PT, OT, SLP   Brilinta (ticagrelor) 90 mg bid and warfarin daily with lovenox bridge until INR therapeutic  Ongoing stroke risk factor control by Primary Care Physician at time of discharge  Follow-up Coumadin Clinic 01/02/2020 at 1430, will get INR check at that time  Follow-up in [facility] in 4 weeks, office to schedule an appointment.   Follow-up Sanjeev Alinda Money) Corliss Skains, MD (Interventional Neuroradiologist) in 4 weeks, office to schedule an appointment.   Follow-up Dr. Campbell Stall, cardiology. APPT 01/10/2020 at 3p  40 minutes were spent preparing discharge.  Marvel Plan, MD PhD Stroke Neurology 01/01/2020 7:36 PM

## 2020-01-01 NOTE — Progress Notes (Signed)
Pt discharge education and instructions completed with pt and father at bedside. Both voices understanding and denies any questions. Pt IV and telemetry removed. Pt discharged home with father to transport him home. Pt home medications/prescriptions delivered to pt at bedside by pharmacy. Pt educated on coumadin and lovenox. Pt self administered his ordered dose prior to discharge. Pt provided handout information on coumadin, food interaction and lovenox administration. Pt home DME cane delivered to pt at bedside. Pt transported off unit via wheelchair with belongings to the side along with father. Dionne Bucy RN

## 2020-01-01 NOTE — Progress Notes (Signed)
Physical Therapy Treatment Patient Details Name: [name] MRN: [id number] DOB: 02/16/1971 Today's Date: 01/01/2020    History of Present Illness Pt is a 49 y.o. male who presented to the ED with nausea, dizziness, speech difficulty, right-sided weakness and facial asymmetry. He has a history of multiple venous occlusions and was started on Eliquis but stopped taking it on Wednesday, 9/29. CT revealed posterior infarcts with BA thrombus. Pt s/p thrombectomy and vascular stent from the dominant distal left vertebral through the proximal basilar. Repeat CT following IR procedures showed new small bilateral occipital pole cortical infarcts suspected but stable CT appearance of mild right PICA infarct, and no associated hemorrhage or mass effect.    PT Comments    Pt is making good progress. He progressed from utilizing a RW to a [facility] with a 2-point gait pattern this date. He does continue to display an occasional R trunk sway with minor LOB bouts requiring minA to recover. Pt's father educated on proper placement to pt's R with mobility to assist as needed for safety. No LOB noted and appropriate foot sequence noted with stair negotiation and utilization of B hand rails this date, with pt's father educated on proper positioning of himself to guard pt with stairs. Pt educated on eye exercises to dec blurry vision, such as placing sticky notes on walls and scanning for them or when not standing to cover the R eye as needed. Secondary to pt's significant progress and minimal necessity for assistance, outpatient PT follow-up and utilization of a SPC is being recommended at this time.  Follow Up Recommendations  Outpatient PT     Equipment Recommendations  Cane    Recommendations for Other Services       Precautions / Restrictions Precautions Precautions: Fall Precaution Comments: dizziness and impaired vision Restrictions Weight Bearing Restrictions: No    Mobility  Bed Mobility Overal bed  mobility: Modified Independent Bed Mobility: Supine to Sit     Supine to sit: Modified independent (Device/Increase time);HOB elevated     General bed mobility comments: HOB elevated, but pt able to transition supine > sit R EOB with slightly increased time, but without safety concerns.  Transfers Overall transfer level: Needs assistance Equipment used: Rolling walker (2 wheeled);None;Straight cane Transfers: Sit to/from Stand Sit to Stand: Min guard         General transfer comment: Unsteadiness noted upon initially coming to stand, requiring a little time to regain balance before continuing with mobility. Transitioned from RW > no device > SPC during session.  Ambulation/Gait Ambulation/Gait assistance: Min guard;Min assist Gait Distance (Feet): 350 Feet (150 first bout; 250 second bout; seated rest break between) Assistive device: Rolling walker (2 wheeled);None;Straight cane Gait Pattern/deviations: Step-through pattern;Decreased stride length;Wide base of support;Decreased step length - right (R lateral trunk sway; decr push off on R) Gait velocity: dec Gait velocity interpretation: 1.31 - 2.62 ft/sec, indicative of limited community ambulator General Gait Details: Started with RW with pt steady and no LOB, thus attempted without assistive device with increased R lateral trunk sway and intermittent minor LOB to R with minA to recover balance. On second gait bout, utilized SPC, demonstrating 3-point then 2-point gait pattern after verbal cues and visual demonstration provided. Displayed decreased occasions of LOB with use of SPC, but occasional R trunk sway. Educated pt's father on proper body placement to pt's R for safety with mobility. VC's provided to improve gait speed, R step length, and R push off, with mod success.   Stairs  Stairs: Yes Stairs assistance: Min guard Stair Management: Two rails;Step to pattern Number of Stairs: 6 General stair comments: Ascending and  descending with B handrails and min guard for safety, no LOB noted. Pt educated on and performed teach back on ascending leading with L and descending leading with R.    Wheelchair Mobility    Modified Rankin (Stroke Patients Only) Modified Rankin (Stroke Patients Only) Pre-Morbid Rankin Score: No symptoms Modified Rankin: Moderately severe disability     Balance Overall balance assessment: Needs assistance Sitting-balance support: Feet supported   Sitting balance - Comments: Pt displayed appropriate seated balance EOB.   Standing balance support: Single extremity supported;No upper extremity supported (altered between 1-no UE support) Standing balance-Leahy Scale: Fair Standing balance comment: Displays intermittent LOB to R with mobility, but able to stand without UE support with min trunk sway.                            Cognition Arousal/Alertness: Awake/alert Behavior During Therapy: WFL for tasks assessed/performed Overall Cognitive Status: Within Functional Limits for tasks assessed                                 General Comments: Pt is aware of his deficits and able to follow commands appropriately.      Exercises      General Comments        Pertinent Vitals/Pain Pain Assessment: No/denies pain Pain Intervention(s): Monitored during session    Home Living                      Prior Function            PT Goals (current goals can now be found in the care plan section) Acute Rehab PT Goals Patient Stated Goal: independence and to go home PT Goal Formulation: With patient/family Time For Goal Achievement: 01/12/20 Potential to Achieve Goals: Good Progress towards PT goals: Progressing toward goals    Frequency    Min 4X/week      PT Plan Discharge plan needs to be updated;Equipment recommendations need to be updated    Co-evaluation              AM-PAC PT "6 Clicks" Mobility   Outcome Measure  Help  needed turning from your back to your side while in a flat bed without using bedrails?: None Help needed moving from lying on your back to sitting on the side of a flat bed without using bedrails?: None Help needed moving to and from a bed to a chair (including a wheelchair)?: A Little Help needed standing up from a chair using your arms (e.g., wheelchair or bedside chair)?: A Little Help needed to walk in hospital room?: A Little Help needed climbing 3-5 steps with a railing? : A Little 6 Click Score: 20    End of Session Equipment Utilized During Treatment: Gait belt Activity Tolerance: Patient tolerated treatment well Patient left: in chair;with call bell/phone within reach;with family/visitor present (father in room)   PT Visit Diagnosis: Unsteadiness on feet (R26.81);Other abnormalities of gait and mobility (R26.89);Muscle weakness (generalized) (M62.81);Difficulty in walking, not elsewhere classified (R26.2);Other symptoms and signs involving the nervous system (R29.898);Hemiplegia and hemiparesis Hemiplegia - Right/Left: Right Hemiplegia - caused by: Cerebral infarction     Time: 1610-9604 PT Time Calculation (min) (ACUTE ONLY): 18 min  Charges:  $Gait Training:  8-22 mins                     Raymond Gurney, PT, DPT Acute Rehabilitation Services  Pager: [phone] Office: [phone]    Jewel Baize 01/01/2020, 4:39 PM

## 2020-01-01 NOTE — TOC Benefit Eligibility Note (Signed)
Transition of Care [facility]) Benefit Eligibility Note    Patient Details  Name: [name] MRN: [id number] Date of Birth: 1971/03/02   Medication/Dose: Enoxaparin 120mg . Q12hrs for 30 day supply  Covered?: Yes  Tier: 2 Drug  Prescription Coverage Preferred Pharmacy: with Person/Company/Phone Number:: [phone] [facility] PH# [phone]  Co-Pay: $20.00  Prior Approval: No  Deductible:  (no decuctible)       [phone] Phone Number: 01/01/2020, 3:16 PM

## 2020-01-02 ENCOUNTER — Encounter (HOSPITAL_COMMUNITY): Payer: Self-pay

## 2020-01-02 ENCOUNTER — Other Ambulatory Visit (HOSPITAL_COMMUNITY): Payer: Self-pay | Admitting: Interventional Radiology

## 2020-01-02 ENCOUNTER — Other Ambulatory Visit: Payer: Self-pay

## 2020-01-02 ENCOUNTER — Ambulatory Visit (INDEPENDENT_AMBULATORY_CARE_PROVIDER_SITE_OTHER): Payer: BC Managed Care – PPO | Admitting: *Deleted

## 2020-01-02 DIAGNOSIS — Z5181 Encounter for therapeutic drug level monitoring: Secondary | ICD-10-CM

## 2020-01-02 DIAGNOSIS — I6322 Cerebral infarction due to unspecified occlusion or stenosis of basilar arteries: Secondary | ICD-10-CM

## 2020-01-02 DIAGNOSIS — I513 Intracardiac thrombosis, not elsewhere classified: Secondary | ICD-10-CM | POA: Diagnosis not present

## 2020-01-02 DIAGNOSIS — I639 Cerebral infarction, unspecified: Secondary | ICD-10-CM

## 2020-01-02 HISTORY — DX: Encounter for therapeutic drug level monitoring: Z51.81

## 2020-01-02 LAB — POCT INR: INR: 1.3 — AB (ref 2.0–3.0)

## 2020-01-02 LAB — PROTHROMBIN GENE MUTATION

## 2020-01-02 NOTE — Patient Instructions (Addendum)
Description   [facility]: [phone].   A full discussion of the nature of anticoagulants has been carried out.  A benefit risk analysis has been presented to the patient, so that they understand the justification for choosing anticoagulation at this time. The need for frequent and regular monitoring, precise dosage adjustment and compliance is stressed.  Side effects of potential bleeding are discussed.  The patient should avoid any OTC items containing aspirin or ibuprofen, and should avoid great swings in general diet.  Avoid alcohol consumption.  Call if any signs of abnormal bleeding.  Description    Continue Lovenox injections, twice a day. Then   take warfarin 1.5 tablets tonight and then start taking 1 tablet daily. Recheck INR on Monday 10/11. [facility]: [phone].

## 2020-01-03 ENCOUNTER — Encounter (HOSPITAL_COMMUNITY): Payer: Self-pay

## 2020-01-03 ENCOUNTER — Telehealth: Payer: Self-pay | Admitting: Cardiology

## 2020-01-03 NOTE — Telephone Encounter (Signed)
Spoke to the patients mother just now and let her know that they should contact the patients PCP for this. She verbalizes understanding and states that they will do this.   Encouraged patient to call back with any questions or concerns.

## 2020-01-03 NOTE — Telephone Encounter (Signed)
Patient has not been sleeping since he has been home from the hospital. The patient does not have an appetite. The patient has also been vomiting for the past 24 hours.  The family does not know what to do. Please call the Mother with advice

## 2020-01-06 ENCOUNTER — Ambulatory Visit (INDEPENDENT_AMBULATORY_CARE_PROVIDER_SITE_OTHER): Payer: BC Managed Care – PPO

## 2020-01-06 ENCOUNTER — Other Ambulatory Visit (HOSPITAL_COMMUNITY): Payer: Self-pay | Admitting: Interventional Radiology

## 2020-01-06 DIAGNOSIS — I6322 Cerebral infarction due to unspecified occlusion or stenosis of basilar arteries: Secondary | ICD-10-CM

## 2020-01-06 DIAGNOSIS — Z5181 Encounter for therapeutic drug level monitoring: Secondary | ICD-10-CM

## 2020-01-06 DIAGNOSIS — I513 Intracardiac thrombosis, not elsewhere classified: Secondary | ICD-10-CM

## 2020-01-06 DIAGNOSIS — I639 Cerebral infarction, unspecified: Secondary | ICD-10-CM

## 2020-01-06 LAB — POCT INR: INR: 2 (ref 2.0–3.0)

## 2020-01-06 NOTE — Patient Instructions (Signed)
Continue Lovenox injections, TONIGHT ONLY, then stop. Then  take warfarin 1.5 tablets tonight and then start taking 1 tablet daily except 1.5 tablets on Mondays. Recheck INR on Monday 10/18. [facility]: [phone].
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
## 2020-01-10 ENCOUNTER — Ambulatory Visit: Payer: BC Managed Care – PPO | Admitting: Cardiology

## 2020-01-10 ENCOUNTER — Other Ambulatory Visit: Payer: Self-pay

## 2020-01-10 ENCOUNTER — Encounter: Payer: Self-pay | Admitting: Cardiology

## 2020-01-10 VITALS — BP 106/72 | HR 100 | Ht 69.0 in | Wt 237.4 lb

## 2020-01-10 DIAGNOSIS — I10 Essential (primary) hypertension: Secondary | ICD-10-CM | POA: Diagnosis not present

## 2020-01-10 DIAGNOSIS — I2510 Atherosclerotic heart disease of native coronary artery without angina pectoris: Secondary | ICD-10-CM | POA: Diagnosis not present

## 2020-01-10 DIAGNOSIS — I513 Intracardiac thrombosis, not elsewhere classified: Secondary | ICD-10-CM | POA: Diagnosis not present

## 2020-01-10 DIAGNOSIS — E7800 Pure hypercholesterolemia, unspecified: Secondary | ICD-10-CM

## 2020-01-10 DIAGNOSIS — I6322 Cerebral infarction due to unspecified occlusion or stenosis of basilar arteries: Secondary | ICD-10-CM | POA: Diagnosis not present

## 2020-01-10 MED ORDER — METOPROLOL TARTRATE 100 MG PO TABS: ORAL_TABLET | ORAL | 0 refills | Status: DC

## 2020-01-10 NOTE — Patient Instructions (Addendum)
Medication Instructions:  Your physician recommends that you continue on your current medications as directed. Please refer to the Current Medication list given to you today.  HOLD losartan if the top number of your blood pressure is less than 125  *If you need a refill on your cardiac medications before your next appointment, please call your pharmacy*   Lab Work: None  If you have labs (blood work) drawn today and your tests are completely normal, you will receive your results only by: Marland Kitchen MyChart Message (if you have MyChart) OR . A paper copy in the mail If you have any lab test that is abnormal or we need to change your treatment, we will call you to review the results.   Testing/Procedures: Your physician has requested that you have cardiac CT.   Follow-Up: Follow up with Dr. Bettina Gavia in 6 weeks   Other Instructions Your cardiac CT will be scheduled at one of the below locations:   [facility] [address] [place], [place] [address] [phone]  [facility] [address] [place], [place] [address] [phone]  If scheduled at [facility], please arrive at the [facility] main entrance of [facility] 30 minutes prior to test start time. Proceed to the [facility] Department (first floor) to check-in and test prep.  If scheduled at [facility], please arrive 15 mins early for check-in and test prep.  Please follow these instructions carefully (unless otherwise directed):  Hold all erectile dysfunction medications at least 3 days (72 hrs) prior to test.  On the Night Before the Test: . Be sure to Drink plenty of water. . Do not consume any caffeinated/decaffeinated beverages or chocolate 12 hours prior to your test. . Do not take any antihistamines 12 hours prior to your test.   On the Day of the Test: . Drink plenty of water. Do not drink any  water within one hour of the test. . Do not eat any food 4 hours prior to the test. . You may take your regular medications prior to the test.  . Take metoprolol (Lopressor) 100 MG two hours prior to test. . HOLD Hydrochlorothiazide morning of the test.       After the Test: . Drink plenty of water. . After receiving IV contrast, you may experience a mild flushed feeling. This is normal. . On occasion, you may experience a mild rash up to 24 hours after the test. This is not dangerous. If this occurs, you can take Benadryl 25 mg and increase your fluid intake. . If you experience trouble breathing, this can be serious. If it is severe call 911 IMMEDIATELY. If it is mild, please call our office.   Once we have confirmed authorization from your insurance company, we will call you to set up a date and time for your test. Based on how quickly your insurance processes prior authorizations requests, please allow up to 4 weeks to be contacted for scheduling your Cardiac CT appointment. Be advised that routine Cardiac CT appointments could be scheduled as many as 8 weeks after your provider has ordered it.  For non-scheduling related questions, please contact the cardiac imaging nurse navigator should you have any questions/concerns: Marchia Bond, Cardiac Imaging Nurse Navigator Burley Saver, Interim Cardiac Imaging Nurse [facility] Direct Office Dial: [phone]   For scheduling needs, including cancellations and rescheduling, please call Vivien Rota at [phone],  option 3.

## 2020-01-13 ENCOUNTER — Other Ambulatory Visit: Payer: Self-pay

## 2020-01-13 ENCOUNTER — Ambulatory Visit (INDEPENDENT_AMBULATORY_CARE_PROVIDER_SITE_OTHER): Payer: BC Managed Care – PPO

## 2020-01-13 DIAGNOSIS — I6322 Cerebral infarction due to unspecified occlusion or stenosis of basilar arteries: Secondary | ICD-10-CM

## 2020-01-13 DIAGNOSIS — Z5181 Encounter for therapeutic drug level monitoring: Secondary | ICD-10-CM

## 2020-01-13 DIAGNOSIS — I513 Intracardiac thrombosis, not elsewhere classified: Secondary | ICD-10-CM

## 2020-01-13 LAB — POCT INR: INR: 1.3 — AB (ref 2.0–3.0)

## 2020-01-13 NOTE — Patient Instructions (Signed)
Take 3 tablets tonight and then increase to 1 tablet daily except 1.5 tablets on Mondays, Wednesday and Friday. Recheck INR on Monday 10/25. [facility]: [phone].

## 2020-01-20 ENCOUNTER — Telehealth (HOSPITAL_COMMUNITY): Payer: Self-pay | Admitting: Radiology

## 2020-01-20 ENCOUNTER — Other Ambulatory Visit: Payer: Self-pay

## 2020-01-20 ENCOUNTER — Ambulatory Visit (INDEPENDENT_AMBULATORY_CARE_PROVIDER_SITE_OTHER): Payer: BC Managed Care – PPO | Admitting: Pharmacist

## 2020-01-20 DIAGNOSIS — I513 Intracardiac thrombosis, not elsewhere classified: Secondary | ICD-10-CM | POA: Diagnosis not present

## 2020-01-20 DIAGNOSIS — Z7901 Long term (current) use of anticoagulants: Secondary | ICD-10-CM

## 2020-01-20 LAB — POCT INR: INR: 1.1 — AB (ref 2.0–3.0)

## 2020-01-20 NOTE — Telephone Encounter (Signed)
Returned pt's call. He wanted to know what his appt was for. 4 wk f/u stroke with intervention with Dr. Corliss Skains. Pt agrees to this plan of care. JM

## 2020-01-27 ENCOUNTER — Ambulatory Visit (INDEPENDENT_AMBULATORY_CARE_PROVIDER_SITE_OTHER): Payer: BC Managed Care – PPO

## 2020-01-27 ENCOUNTER — Other Ambulatory Visit: Payer: Self-pay

## 2020-01-27 DIAGNOSIS — Z5181 Encounter for therapeutic drug level monitoring: Secondary | ICD-10-CM | POA: Diagnosis not present

## 2020-01-27 DIAGNOSIS — I513 Intracardiac thrombosis, not elsewhere classified: Secondary | ICD-10-CM

## 2020-01-27 DIAGNOSIS — I6322 Cerebral infarction due to unspecified occlusion or stenosis of basilar arteries: Secondary | ICD-10-CM

## 2020-01-27 LAB — POCT INR: INR: 1.3 — AB (ref 2.0–3.0)

## 2020-01-27 MED ORDER — TICAGRELOR 90 MG PO TABS
90.0000 mg | ORAL_TABLET | Freq: Two times a day (BID) | ORAL | 2 refills | Status: DC
Start: 2020-01-27 — End: 2020-04-06

## 2020-01-27 MED ORDER — ATORVASTATIN CALCIUM 40 MG PO TABS
40.0000 mg | ORAL_TABLET | Freq: Every day | ORAL | 2 refills | Status: DC
Start: 2020-01-27 — End: 2020-04-27

## 2020-01-27 MED ORDER — WARFARIN SODIUM 5 MG PO TABS
7.5000 mg | ORAL_TABLET | Freq: Every day | ORAL | 2 refills | Status: DC
Start: 2020-01-27 — End: 2020-05-07

## 2020-01-27 NOTE — Patient Instructions (Signed)
Increase to 2 tablet daily except 1 tablets on Saturdays and Sundays. Recheck INR on Monday 02/03/2020. [facility]: [phone].

## 2020-01-31 ENCOUNTER — Ambulatory Visit (HOSPITAL_COMMUNITY): Admission: RE | Admit: 2020-01-31 | Payer: BC Managed Care – PPO | Source: Ambulatory Visit

## 2020-02-03 ENCOUNTER — Other Ambulatory Visit: Payer: Self-pay

## 2020-02-03 ENCOUNTER — Ambulatory Visit (INDEPENDENT_AMBULATORY_CARE_PROVIDER_SITE_OTHER): Payer: BC Managed Care – PPO

## 2020-02-03 DIAGNOSIS — Z5181 Encounter for therapeutic drug level monitoring: Secondary | ICD-10-CM

## 2020-02-03 DIAGNOSIS — I6322 Cerebral infarction due to unspecified occlusion or stenosis of basilar arteries: Secondary | ICD-10-CM

## 2020-02-03 DIAGNOSIS — I513 Intracardiac thrombosis, not elsewhere classified: Secondary | ICD-10-CM | POA: Diagnosis not present

## 2020-02-03 LAB — POCT INR: INR: 1.4 — AB (ref 2.0–3.0)

## 2020-02-03 NOTE — Patient Instructions (Signed)
Take 3 tablets today and then Increase to 2 tablet daily except 1 tablets on Saturdays. Recheck INR on Monday 02/17/2020. [facility]: [phone].

## 2020-02-04 ENCOUNTER — Ambulatory Visit (INDEPENDENT_AMBULATORY_CARE_PROVIDER_SITE_OTHER): Payer: BC Managed Care – PPO | Admitting: Adult Health

## 2020-02-04 ENCOUNTER — Encounter: Payer: Self-pay | Admitting: Adult Health

## 2020-02-04 VITALS — BP 140/92 | HR 91 | Ht 69.0 in | Wt 244.0 lb

## 2020-02-04 DIAGNOSIS — E7800 Pure hypercholesterolemia, unspecified: Secondary | ICD-10-CM

## 2020-02-04 DIAGNOSIS — I10 Essential (primary) hypertension: Secondary | ICD-10-CM

## 2020-02-04 DIAGNOSIS — I6322 Cerebral infarction due to unspecified occlusion or stenosis of basilar arteries: Secondary | ICD-10-CM

## 2020-02-04 DIAGNOSIS — I513 Intracardiac thrombosis, not elsewhere classified: Secondary | ICD-10-CM
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
## 2020-02-04 NOTE — Patient Instructions (Signed)
Continue Brilinta (ticagrelor) 90 mg bid and warfarin daily  and atrovastatin  for secondary stroke prevention  Follow up with Dr. Corliss Skains next week and further discuss use of Brlinta possibly contributing to your shortness of breath  Recommend evaluation by eye doctor in regards to continued visual impairment   Recommend evaluation for possible underlying sleep apnea as untreated sleep apnea can increase your risk of reoccurring stroke and heart disease  Continue to follow up with PCP regarding cholesterol and blood pressure management  Maintain strict control of hypertension with blood pressure goal below 130/90 and cholesterol with LDL cholesterol (bad cholesterol) goal below 70 mg/dL.     Followup in the future with me in 3 months or call earlier if needed       Thank you for coming to see [place] at [facility]. I hope we have been able to provide you high quality care today.  You may receive a patient satisfaction survey over the next few weeks. We would appreciate your feedback and comments so that we may continue to improve ourselves and the health of our patients.    Stroke Prevention Some medical conditions and behaviors are associated with a higher chance of having a stroke. You can help prevent a stroke by making nutrition, lifestyle, and other changes, including managing any medical conditions you may have. What nutrition changes can be made?   Eat healthy foods. You can do this by: ? Choosing foods high in fiber, such as fresh fruits and vegetables and whole grains. ? Eating at least 5 or more servings of fruits and vegetables a day. Try to fill half of your plate at each meal with fruits and vegetables. ? Choosing lean protein foods, such as lean cuts of meat, poultry without skin, fish, tofu, beans, and nuts. ? Eating low-fat dairy products. ? Avoiding foods that are high in salt (sodium). This can help lower blood pressure. ? Avoiding foods that  have saturated fat, trans fat, and cholesterol. This can help prevent high cholesterol. ? Avoiding processed and premade foods.  Follow your health care provider's specific guidelines for losing weight, controlling high blood pressure (hypertension), lowering high cholesterol, and managing diabetes. These may include: ? Reducing your daily calorie intake. ? Limiting your daily sodium intake to 1,500 milligrams (mg). ? Using only healthy fats for cooking, such as olive oil, canola oil, or sunflower oil. ? Counting your daily carbohydrate intake. What lifestyle changes can be made?  Maintain a healthy weight. Talk to your health care provider about your ideal weight.  Get at least 30 minutes of moderate physical activity at least 5 days a week. Moderate activity includes brisk walking, biking, and swimming.  Do not use any products that contain nicotine or tobacco, such as cigarettes and e-cigarettes. If you need help quitting, ask your health care provider. It may also be helpful to avoid exposure to secondhand smoke.  Limit alcohol intake to no more than 1 drink a day for nonpregnant women and 2 drinks a day for men. One drink equals 12 oz of beer, 5 oz of wine, or 1 oz of hard liquor.  Stop any illegal drug use.  Avoid taking birth control pills. Talk to your health care provider about the risks of taking birth control pills if: ? You are over 63 years old. ? You smoke. ? You get migraines. ? You have ever had a blood clot. What other changes can be made?  Manage your cholesterol levels. ? Eating  a healthy diet is important for preventing high cholesterol. If cholesterol cannot be managed through diet alone, you may also need to take medicines. ? Take any prescribed medicines to control your cholesterol as told by your health care provider.  Manage your diabetes. ? Eating a healthy diet and exercising regularly are important parts of managing your blood sugar. If your blood sugar  cannot be managed through diet and exercise, you may need to take medicines. ? Take any prescribed medicines to control your diabetes as told by your health care provider.  Control your hypertension. ? To reduce your risk of stroke, try to keep your blood pressure below 130/80. ? Eating a healthy diet and exercising regularly are an important part of controlling your blood pressure. If your blood pressure cannot be managed through diet and exercise, you may need to take medicines. ? Take any prescribed medicines to control hypertension as told by your health care provider. ? Ask your health care provider if you should monitor your blood pressure at home. ? Have your blood pressure checked every year, even if your blood pressure is normal. Blood pressure increases with age and some medical conditions.  Get evaluated for sleep disorders (sleep apnea). Talk to your health care provider about getting a sleep evaluation if you snore a lot or have excessive sleepiness.  Take over-the-counter and prescription medicines only as told by your health care provider. Aspirin or blood thinners (antiplatelets or anticoagulants) may be recommended to reduce your risk of forming blood clots that can lead to stroke.  Make sure that any other medical conditions you have, such as atrial fibrillation or atherosclerosis, are managed. What are the warning signs of a stroke? The warning signs of a stroke can be easily remembered as BEFAST.  B is for balance. Signs include: ? Dizziness. ? Loss of balance or coordination. ? Sudden trouble walking.  E is for eyes. Signs include: ? A sudden change in vision. ? Trouble seeing.  F is for face. Signs include: ? Sudden weakness or numbness of the face. ? The face or eyelid drooping to one side.  A is for arms. Signs include: ? Sudden weakness or numbness of the arm, usually on one side of the body.  S is for speech. Signs include: ? Trouble speaking  (aphasia). ? Trouble understanding.  T is for time. ? These symptoms may represent a serious problem that is an emergency. Do not wait to see if the symptoms will go away. Get medical help right away. Call your local emergency services (911 in the [place] Do not drive yourself to the hospital.  Other signs of stroke may include: ? A sudden, severe headache with no known cause. ? Nausea or vomiting. ? Seizure. Where to find more information For more information, visit:  American Stroke Association: www.strokeassociation.org  National Stroke Association: www.stroke.org Summary  You can prevent a stroke by eating healthy, exercising, not smoking, limiting alcohol intake, and managing any medical conditions you may have.  Do not use any products that contain nicotine or tobacco, such as cigarettes and e-cigarettes. If you need help quitting, ask your health care provider. It may also be helpful to avoid exposure to secondhand smoke.  Remember BEFAST for warning signs of stroke. Get help right away if you or a loved one has any of these signs. This information is not intended to replace advice given to you by your health care provider. Make sure you discuss any questions you have with your  health care provider. Document Revised: 02/24/2017 Document Reviewed: 04/19/2016 Elsevier Patient Education  2020 Elsevier Inc.    Sleep Apnea Sleep apnea is a condition in which breathing pauses or becomes shallow during sleep. Episodes of sleep apnea usually last 10 seconds or longer, and they may occur as many as 20 times an hour. Sleep apnea disrupts your sleep and keeps your body from getting the rest that it needs. This condition can increase your risk of certain health problems, including:  Heart attack.  Stroke.  Obesity.  Diabetes.  Heart failure.  Irregular heartbeat. What are the causes? There are three kinds of sleep apnea:  Obstructive sleep apnea. This kind is caused by a  blocked or collapsed airway.  Central sleep apnea. This kind happens when the part of the brain that controls breathing does not send the correct signals to the muscles that control breathing.  Mixed sleep apnea. This is a combination of obstructive and central sleep apnea. The most common cause of this condition is a collapsed or blocked airway. An airway can collapse or become blocked if:  Your throat muscles are abnormally relaxed.  Your tongue and tonsils are larger than normal.  You are overweight.  Your airway is smaller than normal. What increases the risk? You are more likely to develop this condition if you:  Are overweight.  Smoke.  Have a smaller than normal airway.  Are elderly.  Are male.  Drink alcohol.  Take sedatives or tranquilizers.  Have a family history of sleep apnea. What are the signs or symptoms? Symptoms of this condition include:  Trouble staying asleep.  Daytime sleepiness and tiredness.  Irritability.  Loud snoring.  Morning headaches.  Trouble concentrating.  Forgetfulness.  Decreased interest in sex.  Unexplained sleepiness.  Mood swings.  Personality changes.  Feelings of depression.  Waking up often during the night to urinate.  Dry mouth.  Sore throat. How is this diagnosed? This condition may be diagnosed with:  A medical history.  A physical exam.  A series of tests that are done while you are sleeping (sleep study). These tests are usually done in a sleep lab, but they may also be done at home. How is this treated? Treatment for this condition aims to restore normal breathing and to ease symptoms during sleep. It may involve managing health issues that can affect breathing, such as high blood pressure or obesity. Treatment may include:  Sleeping on your side.  Using a decongestant if you have nasal congestion.  Avoiding the use of depressants, including alcohol, sedatives, and narcotics.  Losing  weight if you are overweight.  Making changes to your diet.  Quitting smoking.  Using a device to open your airway while you sleep, such as: ? An oral appliance. This is a custom-made mouthpiece that shifts your lower jaw forward. ? A continuous positive airway pressure (CPAP) device. This device blows air through a mask when you breathe out (exhale). ? A nasal expiratory positive airway pressure (EPAP) device. This device has valves that you put into each nostril. ? A bi-level positive airway pressure (BPAP) device. This device blows air through a mask when you breathe in (inhale) and breathe out (exhale).  Having surgery if other treatments do not work. During surgery, excess tissue is removed to create a wider airway. It is important to get treatment for sleep apnea. Without treatment, this condition can lead to:  High blood pressure.  Coronary artery disease.  In men, an inability to achieve  or maintain an erection (impotence).  Reduced thinking abilities. Follow these instructions at home: Lifestyle  Make any lifestyle changes that your health care provider recommends.  Eat a healthy, well-balanced diet.  Take steps to lose weight if you are overweight.  Avoid using depressants, including alcohol, sedatives, and narcotics.  Do not use any products that contain nicotine or tobacco, such as cigarettes, e-cigarettes, and chewing tobacco. If you need help quitting, ask your health care provider. General instructions  Take over-the-counter and prescription medicines only as told by your health care provider.  If you were given a device to open your airway while you sleep, use it only as told by your health care provider.  If you are having surgery, make sure to tell your health care provider you have sleep apnea. You may need to bring your device with you.  Keep all follow-up visits as told by your health care provider. This is important. Contact a health care provider  if:  The device that you received to open your airway during sleep is uncomfortable or does not seem to be working.  Your symptoms do not improve.  Your symptoms get worse. Get help right away if:  You develop: ? Chest pain. ? Shortness of breath. ? Discomfort in your back, arms, or stomach.  You have: ? Trouble speaking. ? Weakness on one side of your body. ? Drooping in your face. These symptoms may represent a serious problem that is an emergency. Do not wait to see if the symptoms will go away. Get medical help right away. Call your local emergency services (911 in the [place] Do not drive yourself to the hospital. Summary  Sleep apnea is a condition in which breathing pauses or becomes shallow during sleep.  The most common cause is a collapsed or blocked airway.  The goal of treatment is to restore normal breathing and to ease symptoms during sleep. This information is not intended to replace advice given to you by your health care provider. Make sure you discuss any questions you have with your health care provider. Document Revised: 08/29/2018 Document Reviewed: 11/07/2017 Elsevier Patient Education  2020 [employer].

## 2020-02-05 NOTE — Progress Notes (Signed)
I agree with the above plan 

## 2020-02-13 ENCOUNTER — Ambulatory Visit (HOSPITAL_COMMUNITY): Admission: RE | Admit: 2020-02-13 | Payer: BC Managed Care – PPO | Source: Ambulatory Visit

## 2020-02-13 ENCOUNTER — Encounter (HOSPITAL_COMMUNITY): Payer: Self-pay

## 2020-02-18 ENCOUNTER — Telehealth (HOSPITAL_COMMUNITY): Payer: Self-pay | Admitting: Emergency Medicine

## 2020-02-18 NOTE — Telephone Encounter (Signed)
Reaching out to patient to offer assistance regarding upcoming cardiac imaging study; pt verbalizes understanding of appt date/time, parking situation and where to check in, pre-test NPO status and medications ordered, and verified current allergies; name and call back number provided for further questions should they arise Rockwell Alexandria RN Navigator Cardiac Imaging [facility] [phone] office [phone] cell  100mg  metoprolol tartrate to be taken 2 hr prior to scan

## 2020-02-19 ENCOUNTER — Ambulatory Visit (HOSPITAL_COMMUNITY)
Admission: RE | Admit: 2020-02-19 | Discharge: 2020-02-19 | Disposition: A | Discharge status: Home / Self Care | Payer: BC Managed Care – PPO | Source: Ambulatory Visit | Attending: Cardiology | Admitting: Cardiology

## 2020-02-19 ENCOUNTER — Other Ambulatory Visit: Payer: Self-pay

## 2020-02-19 DIAGNOSIS — I10 Essential (primary) hypertension: Secondary | ICD-10-CM | POA: Diagnosis present

## 2020-02-19 DIAGNOSIS — I2510 Atherosclerotic heart disease of native coronary artery without angina pectoris: Secondary | ICD-10-CM | POA: Insufficient documentation

## 2020-02-19 DIAGNOSIS — E7800 Pure hypercholesterolemia, unspecified: Secondary | ICD-10-CM | POA: Diagnosis present

## 2020-02-19 DIAGNOSIS — D689 Coagulation defect, unspecified: Secondary | ICD-10-CM | POA: Insufficient documentation

## 2020-02-19 DIAGNOSIS — I513 Intracardiac thrombosis, not elsewhere classified: Secondary | ICD-10-CM | POA: Diagnosis present

## 2020-02-19 DIAGNOSIS — I6322 Cerebral infarction due to unspecified occlusion or stenosis of basilar arteries: Secondary | ICD-10-CM | POA: Insufficient documentation

## 2020-02-19 DIAGNOSIS — M545 Low back pain, unspecified: Secondary | ICD-10-CM | POA: Insufficient documentation

## 2020-02-19 DIAGNOSIS — K429 Umbilical hernia without obstruction or gangrene: Secondary | ICD-10-CM | POA: Insufficient documentation

## 2020-02-19 MED ORDER — NITROGLYCERIN 0.4 MG SL SUBL
0.8000 mg | SUBLINGUAL_TABLET | Freq: Once | SUBLINGUAL | Status: AC
  Administered 2020-02-19: 0.8 mg via SUBLINGUAL

## 2020-02-19 MED ORDER — IOHEXOL 350 MG/ML SOLN
90.0000 mL | Freq: Once | INTRAVENOUS | Status: AC | PRN
  Administered 2020-02-19: 90 mL via INTRAVENOUS

## 2020-02-19 MED ORDER — NITROGLYCERIN 0.4 MG SL SUBL
SUBLINGUAL_TABLET | SUBLINGUAL | Status: AC
  Filled 2020-02-19: qty 2

## 2020-02-20 ENCOUNTER — Ambulatory Visit (HOSPITAL_COMMUNITY)
Admission: RE | Admit: 2020-02-20 | Discharge: 2020-02-20 | Disposition: A | Discharge status: Home / Self Care | Payer: BC Managed Care – PPO | Source: Ambulatory Visit | Attending: Cardiology | Admitting: Cardiology

## 2020-02-20 DIAGNOSIS — I6322 Cerebral infarction due to unspecified occlusion or stenosis of basilar arteries: Secondary | ICD-10-CM | POA: Diagnosis present

## 2020-02-20 DIAGNOSIS — I2510 Atherosclerotic heart disease of native coronary artery without angina pectoris: Secondary | ICD-10-CM | POA: Insufficient documentation

## 2020-02-20 DIAGNOSIS — I10 Essential (primary) hypertension: Secondary | ICD-10-CM | POA: Diagnosis present

## 2020-02-20 DIAGNOSIS — E7800 Pure hypercholesterolemia, unspecified: Secondary | ICD-10-CM | POA: Insufficient documentation

## 2020-02-20 DIAGNOSIS — I513 Intracardiac thrombosis, not elsewhere classified: Secondary | ICD-10-CM | POA: Insufficient documentation

## 2020-02-24 ENCOUNTER — Ambulatory Visit (INDEPENDENT_AMBULATORY_CARE_PROVIDER_SITE_OTHER): Payer: BC Managed Care – PPO

## 2020-02-24 ENCOUNTER — Other Ambulatory Visit: Payer: Self-pay

## 2020-02-24 ENCOUNTER — Telehealth: Payer: Self-pay

## 2020-02-24 DIAGNOSIS — Z5181 Encounter for therapeutic drug level monitoring: Secondary | ICD-10-CM

## 2020-02-24 DIAGNOSIS — I513 Intracardiac thrombosis, not elsewhere classified: Secondary | ICD-10-CM

## 2020-02-24 DIAGNOSIS — I6322 Cerebral infarction due to unspecified occlusion or stenosis of basilar arteries: Secondary | ICD-10-CM | POA: Diagnosis not present

## 2020-02-24 DIAGNOSIS — I2510 Atherosclerotic heart disease of native coronary artery without angina pectoris: Secondary | ICD-10-CM | POA: Diagnosis not present

## 2020-02-24 LAB — POCT INR: INR: 1.6 — AB (ref 2.0–3.0)

## 2020-02-24 NOTE — Telephone Encounter (Signed)
-----   Message from Baldo Daub, MD sent at 02/24/2020  9:11 AM EST ----- As expected he has CAD and a cardiac thrombus or clot.  Appear to require heart catheterization stent or surgery continue current medications and I can review the results with him in detail at his office follow-up.

## 2020-02-24 NOTE — Telephone Encounter (Signed)
Spoke with patient regarding results and recommendation.  Patient verbalizes understanding and is agreeable to plan of care. Advised patient to call back with any issues or concerns.  

## 2020-02-24 NOTE — Patient Instructions (Signed)
Take 3 tablets today and then Increase to 2 tablet daily. Recheck INR 2 weeks. [facility]: [phone].

## 2020-02-28 ENCOUNTER — Encounter: Payer: Self-pay | Admitting: Cardiology

## 2020-02-28 ENCOUNTER — Other Ambulatory Visit: Payer: Self-pay

## 2020-02-28 ENCOUNTER — Ambulatory Visit: Payer: BC Managed Care – PPO | Admitting: Cardiology

## 2020-02-28 VITALS — BP 117/75 | HR 80 | Ht 69.0 in | Wt 248.2 lb

## 2020-02-28 DIAGNOSIS — I513 Intracardiac thrombosis, not elsewhere classified: Secondary | ICD-10-CM

## 2020-02-28 DIAGNOSIS — I2510 Atherosclerotic heart disease of native coronary artery without angina pectoris: Secondary | ICD-10-CM

## 2020-02-28 DIAGNOSIS — I6322 Cerebral infarction due to unspecified occlusion or stenosis of basilar arteries: Secondary | ICD-10-CM

## 2020-02-28 DIAGNOSIS — I119 Hypertensive heart disease without heart failure: Secondary | ICD-10-CM | POA: Insufficient documentation

## 2020-02-28 DIAGNOSIS — E7800 Pure hypercholesterolemia, unspecified: Secondary | ICD-10-CM

## 2020-02-28 DIAGNOSIS — Z7901 Long term (current) use of anticoagulants: Secondary | ICD-10-CM | POA: Diagnosis not present

## 2020-02-28 HISTORY — DX: Hypertensive heart disease without heart failure: I11.9
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
## 2020-02-28 NOTE — Patient Instructions (Signed)
Medication Instructions:   Your physician recommends that you continue on your current medications as directed. Please refer to the Current Medication list given to you today.  *If you need a refill on your cardiac medications before your next appointment, please call your pharmacy*   Lab Work: Your physician recommends that you return for lab work in: TODAY CMP, Lipdis, Lpa  If you have labs (blood work) drawn today and your tests are completely normal, you will receive your results only by: Marland Kitchen MyChart Message (if you have MyChart) OR . A paper copy in the mail If you have any lab test that is abnormal or we need to change your treatment, we will call you to review the results.   Testing/Procedures: None   Follow-Up: At [facility], you and your health needs are our priority.  As part of our continuing mission to provide you with exceptional heart care, we have created designated Provider Care Teams.  These Care Teams include your primary Cardiologist (physician) and Advanced Practice Providers (APPs -  Physician Assistants and Nurse Practitioners) who all work together to provide you with the care you need, when you need it.  We recommend signing up for the patient portal called "MyChart".  Sign up information is provided on this After Visit Summary.  MyChart is used to connect with patients for Virtual Visits (Telemedicine).  Patients are able to view lab/test results, encounter notes, upcoming appointments, etc.  Non-urgent messages can be sent to your provider as well.   To learn more about what you can do with MyChart, go to [email or web address].    Your next appointment:   6 month(s)  The format for your next appointment:   In Person  Provider:   Norman Herrlich, MD   Other Instructions

## 2020-02-29 LAB — LIPID PANEL
Chol/HDL Ratio: 4.3 ratio (ref 0.0–5.0)
Cholesterol, Total: 124 mg/dL (ref 100–199)
HDL: 29 mg/dL — ABNORMAL LOW (ref >39)
LDL Chol Calc (NIH): 68 mg/dL (ref 0–99)
Triglycerides: 152 mg/dL — ABNORMAL HIGH (ref 0–149)
VLDL Cholesterol Cal: 27 mg/dL (ref 5–40)

## 2020-02-29 LAB — COMPREHENSIVE METABOLIC PANEL
ALT: 20 IU/L (ref 0–44)
AST: 18 IU/L (ref 0–40)
Albumin/Globulin Ratio: 1.9 (ref 1.2–2.2)
Albumin: 4.7 g/dL (ref 4.0–5.0)
Alkaline Phosphatase: 110 IU/L (ref 44–121)
BUN/Creatinine Ratio: 14 (ref 9–20)
BUN: 12 mg/dL (ref 6–24)
Bilirubin Total: 0.4 mg/dL (ref 0.0–1.2)
CO2: 24 mmol/L (ref 20–29)
Calcium: 9.5 mg/dL (ref 8.7–10.2)
Chloride: 101 mmol/L (ref 96–106)
Creatinine, Ser: 0.86 mg/dL (ref 0.76–1.27)
GFR calc Af Amer: 119 mL/min/{1.73_m2} (ref >59)
GFR calc non Af Amer: 103 mL/min/{1.73_m2} (ref >59)
Globulin, Total: 2.5 g/dL (ref 1.5–4.5)
Glucose: 113 mg/dL — ABNORMAL HIGH (ref 65–99)
Potassium: 3.8 mmol/L (ref 3.5–5.2)
Sodium: 139 mmol/L (ref 134–144)
Total Protein: 7.2 g/dL (ref 6.0–8.5)

## 2020-02-29 LAB — LIPOPROTEIN A (LPA): Lipoprotein (a): 249.9 nmol/L — ABNORMAL HIGH (ref <75.0)

## 2020-03-02 ENCOUNTER — Telehealth: Payer: Self-pay

## 2020-03-02 NOTE — Telephone Encounter (Signed)
-----   Message from Baldo Daub, MD sent at 03/01/2020  9:44 AM EST ----- Good results no changes

## 2020-03-02 NOTE — Telephone Encounter (Signed)
Left message on patients voicemail to please return our call.   

## 2020-03-02 NOTE — Telephone Encounter (Signed)
Spoke with patient regarding results and recommendation.  Patient verbalizes understanding and is agreeable to plan of care. Advised patient to call back with any issues or concerns.  

## 2020-03-02 NOTE — Telephone Encounter (Signed)
-----   Message from Brian J Munley, MD sent at 03/01/2020  9:44 AM EST ----- Good results no changes 

## 2020-03-09 ENCOUNTER — Ambulatory Visit (INDEPENDENT_AMBULATORY_CARE_PROVIDER_SITE_OTHER): Payer: BC Managed Care – PPO

## 2020-03-09 ENCOUNTER — Other Ambulatory Visit: Payer: Self-pay

## 2020-03-09 DIAGNOSIS — I513 Intracardiac thrombosis, not elsewhere classified: Secondary | ICD-10-CM

## 2020-03-09 DIAGNOSIS — I6322 Cerebral infarction due to unspecified occlusion or stenosis of basilar arteries: Secondary | ICD-10-CM

## 2020-03-09 DIAGNOSIS — Z5181 Encounter for therapeutic drug level monitoring: Secondary | ICD-10-CM | POA: Diagnosis not present

## 2020-03-09 LAB — POCT INR: INR: 2 (ref 2.0–3.0)

## 2020-03-09 NOTE — Patient Instructions (Signed)
Continue 2 tablets daily. Recheck INR 4 weeks. [facility]: [phone].

## 2020-03-17 ENCOUNTER — Telehealth: Payer: Self-pay | Admitting: Adult Health

## 2020-03-17 NOTE — Telephone Encounter (Signed)
Pt called wanting to know if he can get a letter for his work permitting him to return. Please advise.

## 2020-03-17 NOTE — Telephone Encounter (Signed)
I called [name].  He has been out of work for 3 months. He is ready to go back.  He feels that his vision is clear that he is driving.  Saw optometrist in [place], [place] stated he was ok to dirve.  He has not been back but is asking for a referral to an opthamologist in [place] to see.  He did not have STD or LTD, as his DH Valentina Lucks had continued to pay his weekly salary.  Would like letter to go back to work.  Please advise.

## 2020-03-17 NOTE — Telephone Encounter (Signed)
If he is feeling better and was cleared by ophthalmology to drive, he can be cleared to return back to work.  Patient's typically do not need a referral to an ophthalmologist

## 2020-03-18 ENCOUNTER — Encounter: Payer: Self-pay | Admitting: *Deleted

## 2020-03-18 NOTE — Telephone Encounter (Signed)
I called pt and LMVM for him that ok to go back to work per JM/NP.  I relayed in VM that since saw optometrist cleared to drive and was feeling better then her standpoint ok to to go back to work .  No referral needed, see opthamologist of his choice.  Gave Dr. Dione Booze, Dr. Elmer Picker.  Letter to be made on mychart.  DONe.

## 2020-03-31 ENCOUNTER — Other Ambulatory Visit: Payer: Self-pay

## 2020-03-31 NOTE — Patient Outreach (Signed)
[facility]) Care Management  03/31/2020  HYDER DEMAN 18-Jan-1971 [id number]   First telephone outreach attempt to obtain mRS. No answer. Left message for returned call.  [name] [facility] Management Assistant [phone]

## 2020-04-02 ENCOUNTER — Telehealth: Payer: Self-pay | Admitting: Cardiology

## 2020-04-02 ENCOUNTER — Telehealth: Payer: Self-pay

## 2020-04-02 NOTE — Telephone Encounter (Signed)
Attempted to call the patient. The person answering the phone advised he was not home at this time. I left a message for him to please call back to the [place] office. Contact # given.

## 2020-04-02 NOTE — Telephone Encounter (Signed)
-----   Message from Brian J Munley, MD sent at 04/01/2020  4:55 PM EST ----- Overall I think this is a good report for him and does not look like he needs to have a heart catheterization surgery or stent.  I can review further with him at his next office appointment 

## 2020-04-02 NOTE — Telephone Encounter (Signed)
Left message on patients voicemail to please return our call.   

## 2020-04-02 NOTE — Telephone Encounter (Signed)
Patient is returning call to discuss results. 

## 2020-04-02 NOTE — Telephone Encounter (Signed)
Follow up:     Patient returning a call back concering some results. Also patient need a note for returning back to work can be sent threw my chart or he will come and pick it up.

## 2020-04-03 ENCOUNTER — Telehealth: Payer: Self-pay

## 2020-04-03 NOTE — Telephone Encounter (Signed)
-----   Message from Brian J Munley, MD sent at 04/01/2020  4:55 PM EST ----- Overall I think this is a good report for him and does not look like he needs to have a heart catheterization surgery or stent.  I can review further with him at his next office appointment 

## 2020-04-03 NOTE — Telephone Encounter (Signed)
Left message on patients voicemail to please return our call.   

## 2020-04-04 ENCOUNTER — Other Ambulatory Visit: Payer: Self-pay | Admitting: Cardiology

## 2020-04-06 ENCOUNTER — Telehealth: Payer: Self-pay

## 2020-04-06 NOTE — Telephone Encounter (Signed)
-----   Message from Baldo Daub, MD sent at 04/01/2020  4:55 PM EST ----- Overall I think this is a good report for him and does not look like he needs to have a heart catheterization surgery or stent.  I can review further with him at his next office appointment

## 2020-04-06 NOTE — Telephone Encounter (Signed)
Spoke with patient regarding results and recommendation.  Patient verbalizes understanding and is agreeable to plan of care. Advised patient to call back with any issues or concerns.  

## 2020-04-07 ENCOUNTER — Other Ambulatory Visit: Payer: Self-pay

## 2020-04-07 NOTE — Patient Outreach (Signed)
[facility]) Care Management  04/07/2020  [name] 03/27/1971 [id number]  Second telephone outreach attempt to obtain mRS. No answer. Left message for returned call.  [name] [facility] Management Assistant [phone]

## 2020-04-10 ENCOUNTER — Other Ambulatory Visit: Payer: Self-pay

## 2020-04-10 ENCOUNTER — Ambulatory Visit (INDEPENDENT_AMBULATORY_CARE_PROVIDER_SITE_OTHER): Payer: BC Managed Care – PPO

## 2020-04-10 DIAGNOSIS — Z5181 Encounter for therapeutic drug level monitoring: Secondary | ICD-10-CM | POA: Diagnosis not present

## 2020-04-10 DIAGNOSIS — I6322 Cerebral infarction due to unspecified occlusion or stenosis of basilar arteries: Secondary | ICD-10-CM

## 2020-04-10 DIAGNOSIS — I513 Intracardiac thrombosis, not elsewhere classified: Secondary | ICD-10-CM | POA: Diagnosis not present

## 2020-04-10 LAB — POCT INR: INR: 3.9 — AB (ref 2.0–3.0)

## 2020-04-10 NOTE — Patient Instructions (Signed)
Hold tonight and then Continue 2 tablets daily. Recheck INR 4 weeks. [facility]: [phone].

## 2020-04-14 ENCOUNTER — Other Ambulatory Visit: Payer: Self-pay

## 2020-04-14 NOTE — Patient Outreach (Signed)
[facility]) Care Management  04/14/2020  [name] 1970/08/02 [id number]   3 outreach attempts were completed to obtain mRs. mRs could not be obtained because patient never returned my calls. mRs=7    [name] Care Management Assistant [phone]

## 2020-04-26 ENCOUNTER — Other Ambulatory Visit: Payer: Self-pay | Admitting: Cardiology

## 2020-04-27 NOTE — Telephone Encounter (Signed)
Rx refill sent to pharmacy. 

## 2020-05-05 ENCOUNTER — Other Ambulatory Visit (HOSPITAL_COMMUNITY): Payer: Self-pay | Admitting: Interventional Radiology

## 2020-05-05 DIAGNOSIS — I639 Cerebral infarction, unspecified: Secondary | ICD-10-CM

## 2020-05-07 ENCOUNTER — Ambulatory Visit (INDEPENDENT_AMBULATORY_CARE_PROVIDER_SITE_OTHER): Payer: BC Managed Care – PPO | Admitting: Adult Health

## 2020-05-07 ENCOUNTER — Other Ambulatory Visit: Payer: Self-pay

## 2020-05-07 ENCOUNTER — Encounter: Payer: Self-pay | Admitting: Adult Health

## 2020-05-07 ENCOUNTER — Ambulatory Visit (INDEPENDENT_AMBULATORY_CARE_PROVIDER_SITE_OTHER): Payer: BC Managed Care – PPO | Admitting: Pharmacist

## 2020-05-07 VITALS — BP 145/90 | HR 105 | Ht 69.0 in | Wt 248.0 lb

## 2020-05-07 DIAGNOSIS — I513 Intracardiac thrombosis, not elsewhere classified: Secondary | ICD-10-CM

## 2020-05-07 DIAGNOSIS — Z5181 Encounter for therapeutic drug level monitoring: Secondary | ICD-10-CM | POA: Diagnosis not present

## 2020-05-07 DIAGNOSIS — E538 Deficiency of other specified B group vitamins: Secondary | ICD-10-CM | POA: Diagnosis not present

## 2020-05-07 DIAGNOSIS — I69398 Other sequelae of cerebral infarction: Secondary | ICD-10-CM | POA: Diagnosis not present

## 2020-05-07 DIAGNOSIS — I6322 Cerebral infarction due to unspecified occlusion or stenosis of basilar arteries: Secondary | ICD-10-CM

## 2020-05-07 DIAGNOSIS — H547 Unspecified visual loss: Secondary | ICD-10-CM

## 2020-05-07 LAB — POCT INR: INR: 2.4 (ref 2.0–3.0)

## 2020-05-07 MED ORDER — WARFARIN SODIUM 5 MG PO TABS
7.5000 mg | ORAL_TABLET | Freq: Every day | ORAL | 1 refills | Status: DC
Start: 2020-05-07 — End: 2020-05-18
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
## 2020-05-07 NOTE — Progress Notes (Signed)
I agree with the above plan 

## 2020-05-07 NOTE — Patient Instructions (Signed)
Continue Brilinta (ticagrelor) 90 mg bid and warfarin daily  and atorvastatin for secondary stroke prevention  Continue to follow with Coumadin clinic and cardiology for warfarin and INR level monitoring  Continue to follow-up with Dr. Corliss Skains with repeat imaging scheduled on 05/15/2020 as well as discussion regarding ongoing need of Brilinta  Continue to follow up with PCP regarding cholesterol and blood pressure management  Maintain strict control of hypertension with blood pressure goal below 130/90  and cholesterol with LDL cholesterol (bad cholesterol) goal below 70 mg/dL.       Followup in the future with me in 6 months or call earlier if needed       Thank you for coming to see [place] at [facility]. I hope we have been able to provide you high quality care today.  You may receive a patient satisfaction survey over the next few weeks. We would appreciate your feedback and comments so that we may continue to improve ourselves and the health of our patients.

## 2020-05-08 LAB — VITAMIN B12: Vitamin B-12: 628 pg/mL (ref 232–1245)

## 2020-05-08 LAB — HOMOCYSTEINE: Homocysteine: 9.6 umol/L (ref 0.0–14.5)

## 2020-05-08 LAB — FOLATE: Folate: 13.7 ng/mL (ref >3.0)

## 2020-05-13 ENCOUNTER — Encounter: Payer: Self-pay | Admitting: *Deleted

## 2020-05-14 ENCOUNTER — Other Ambulatory Visit: Payer: Self-pay | Admitting: Cardiology

## 2020-05-15 ENCOUNTER — Other Ambulatory Visit: Payer: Self-pay

## 2020-05-15 ENCOUNTER — Ambulatory Visit (HOSPITAL_COMMUNITY)
Admission: RE | Admit: 2020-05-15 | Discharge: 2020-05-15 | Disposition: A | Discharge status: Home / Self Care | Payer: BC Managed Care – PPO | Source: Ambulatory Visit | Attending: Interventional Radiology | Admitting: Interventional Radiology

## 2020-05-15 DIAGNOSIS — I639 Cerebral infarction, unspecified: Secondary | ICD-10-CM | POA: Diagnosis not present

## 2020-05-15 LAB — POCT I-STAT CREATININE: Creatinine, Ser: 0.8 mg/dL (ref 0.61–1.24)

## 2020-05-15 MED ORDER — IOHEXOL 350 MG/ML SOLN
100.0000 mL | Freq: Once | INTRAVENOUS | Status: AC | PRN
  Administered 2020-05-15: 100 mL via INTRAVENOUS

## 2020-05-18 ENCOUNTER — Telehealth (HOSPITAL_COMMUNITY): Payer: Self-pay

## 2020-05-18 NOTE — Telephone Encounter (Signed)
Pt agreed to f/u in 4 months with diagnostic angiogram. AW

## 2020-06-12 ENCOUNTER — Ambulatory Visit (INDEPENDENT_AMBULATORY_CARE_PROVIDER_SITE_OTHER): Payer: BC Managed Care – PPO

## 2020-06-12 ENCOUNTER — Other Ambulatory Visit: Payer: Self-pay

## 2020-06-12 DIAGNOSIS — Z5181 Encounter for therapeutic drug level monitoring: Secondary | ICD-10-CM | POA: Diagnosis not present

## 2020-06-12 DIAGNOSIS — I513 Intracardiac thrombosis, not elsewhere classified: Secondary | ICD-10-CM | POA: Diagnosis not present

## 2020-06-12 DIAGNOSIS — I6322 Cerebral infarction due to unspecified occlusion or stenosis of basilar arteries: Secondary | ICD-10-CM

## 2020-06-12 LAB — POCT INR: INR: 1.8 — AB (ref 2.0–3.0)

## 2020-06-12 NOTE — Patient Instructions (Signed)
Take 3 tablets tonight only and then Continue 2 tablets daily. Recheck INR 6 weeks. [facility]: [phone].

## 2020-06-27 ENCOUNTER — Other Ambulatory Visit: Payer: Self-pay | Admitting: Cardiovascular Disease

## 2020-07-24 ENCOUNTER — Ambulatory Visit (INDEPENDENT_AMBULATORY_CARE_PROVIDER_SITE_OTHER): Payer: BC Managed Care – PPO

## 2020-07-24 ENCOUNTER — Other Ambulatory Visit: Payer: Self-pay

## 2020-07-24 DIAGNOSIS — Z5181 Encounter for therapeutic drug level monitoring: Secondary | ICD-10-CM | POA: Diagnosis not present

## 2020-07-24 DIAGNOSIS — I513 Intracardiac thrombosis, not elsewhere classified: Secondary | ICD-10-CM

## 2020-07-24 DIAGNOSIS — I6322 Cerebral infarction due to unspecified occlusion or stenosis of basilar arteries: Secondary | ICD-10-CM

## 2020-07-24 LAB — POCT INR: INR: 1.7 — AB (ref 2.0–3.0)

## 2020-07-24 NOTE — Patient Instructions (Signed)
Take 2.5 tablets tonight only and then Continue 2 tablets daily. Recheck INR 6 weeks. [facility]: [phone].

## 2020-07-31 ENCOUNTER — Other Ambulatory Visit: Payer: Self-pay | Admitting: Cardiology

## 2020-07-31 NOTE — Telephone Encounter (Signed)
Brilinta 90 mg # 60 x 5 refills sent to pharmacy

## 2020-08-10 ENCOUNTER — Emergency Department (HOSPITAL_COMMUNITY): Payer: BC Managed Care – PPO

## 2020-08-10 ENCOUNTER — Emergency Department (HOSPITAL_COMMUNITY)
Admission: EM | Admit: 2020-08-10 | Discharge: 2020-08-10 | Disposition: A | Discharge status: Home / Self Care | Payer: BC Managed Care – PPO | Attending: Emergency Medicine | Admitting: Emergency Medicine

## 2020-08-10 ENCOUNTER — Other Ambulatory Visit: Payer: Self-pay

## 2020-08-10 ENCOUNTER — Encounter (HOSPITAL_COMMUNITY): Payer: Self-pay

## 2020-08-10 DIAGNOSIS — X58XXXA Exposure to other specified factors, initial encounter: Secondary | ICD-10-CM | POA: Diagnosis not present

## 2020-08-10 DIAGNOSIS — R109 Unspecified abdominal pain: Secondary | ICD-10-CM

## 2020-08-10 DIAGNOSIS — F17200 Nicotine dependence, unspecified, uncomplicated: Secondary | ICD-10-CM | POA: Insufficient documentation

## 2020-08-10 DIAGNOSIS — I10 Essential (primary) hypertension: Secondary | ICD-10-CM | POA: Diagnosis not present

## 2020-08-10 DIAGNOSIS — S3991XA Unspecified injury of abdomen, initial encounter: Secondary | ICD-10-CM | POA: Diagnosis present

## 2020-08-10 DIAGNOSIS — Z79899 Other long term (current) drug therapy: Secondary | ICD-10-CM | POA: Insufficient documentation

## 2020-08-10 DIAGNOSIS — Z7901 Long term (current) use of anticoagulants: Secondary | ICD-10-CM | POA: Insufficient documentation

## 2020-08-10 DIAGNOSIS — S301XXA Contusion of abdominal wall, initial encounter: Secondary | ICD-10-CM | POA: Insufficient documentation

## 2020-08-10 LAB — URINALYSIS, ROUTINE W REFLEX MICROSCOPIC
Bacteria, UA: NONE SEEN
Glucose, UA: NEGATIVE mg/dL
Hgb urine dipstick: NEGATIVE
Ketones, ur: 5 mg/dL — AB
Leukocytes,Ua: NEGATIVE
Nitrite: NEGATIVE
Protein, ur: 30 mg/dL — AB
Specific Gravity, Urine: 1.038 — ABNORMAL HIGH (ref 1.005–1.030)
pH: 5 (ref 5.0–8.0)

## 2020-08-10 LAB — CBC
HCT: 46.7 % (ref 39.0–52.0)
Hemoglobin: 15.6 g/dL (ref 13.0–17.0)
MCH: 30.6 pg (ref 26.0–34.0)
MCHC: 33.4 g/dL (ref 30.0–36.0)
MCV: 91.6 fL (ref 80.0–100.0)
Platelets: 249 10*3/uL (ref 150–400)
RBC: 5.1 MIL/uL (ref 4.22–5.81)
RDW: 13.3 % (ref 11.5–15.5)
WBC: 13.6 10*3/uL — ABNORMAL HIGH (ref 4.0–10.5)
nRBC: 0 % (ref 0.0–0.2)

## 2020-08-10 LAB — COMPREHENSIVE METABOLIC PANEL
ALT: 23 U/L (ref 0–44)
AST: 21 U/L (ref 15–41)
Albumin: 3.6 g/dL (ref 3.5–5.0)
Alkaline Phosphatase: 82 U/L (ref 38–126)
Anion gap: 8 (ref 5–15)
BUN: 15 mg/dL (ref 6–20)
CO2: 30 mmol/L (ref 22–32)
Calcium: 9.7 mg/dL (ref 8.9–10.3)
Chloride: 100 mmol/L (ref 98–111)
Creatinine, Ser: 0.91 mg/dL (ref 0.61–1.24)
GFR, Estimated: >60 mL/min (ref >60)
Glucose, Bld: 151 mg/dL — ABNORMAL HIGH (ref 70–99)
Potassium: 3.6 mmol/L (ref 3.5–5.1)
Sodium: 138 mmol/L (ref 135–145)
Total Bilirubin: 0.6 mg/dL (ref 0.3–1.2)
Total Protein: 7.5 g/dL (ref 6.5–8.1)

## 2020-08-10 LAB — PROTIME-INR
INR: 2.5 — ABNORMAL HIGH (ref 0.8–1.2)
Prothrombin Time: 27.4 seconds — ABNORMAL HIGH (ref 11.4–15.2)

## 2020-08-10 LAB — LIPASE, BLOOD: Lipase: 33 U/L (ref 11–51)

## 2020-08-10 LAB — APTT: aPTT: 44 seconds — ABNORMAL HIGH (ref 24–36)

## 2020-08-10 MED ORDER — ONDANSETRON HCL 4 MG/2ML IJ SOLN
4.0000 mg | Freq: Once | INTRAMUSCULAR | Status: AC
  Administered 2020-08-10: 4 mg via INTRAVENOUS
  Filled 2020-08-10: qty 2

## 2020-08-10 MED ORDER — SODIUM CHLORIDE 0.9 % IV BOLUS
500.0000 mL | Freq: Once | INTRAVENOUS | Status: AC
  Administered 2020-08-10: 500 mL via INTRAVENOUS

## 2020-08-10 MED ORDER — HYDROMORPHONE HCL 1 MG/ML IJ SOLN
0.5000 mg | Freq: Once | INTRAMUSCULAR | Status: AC
  Administered 2020-08-10: 0.5 mg via INTRAVENOUS
  Filled 2020-08-10: qty 1
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
## 2020-08-10 NOTE — Discharge Instructions (Signed)
Recheck with your primary care provider in the next 2 days.  Return to the ED for new or worsening symptoms.

## 2020-08-10 NOTE — ED Triage Notes (Signed)
Patient reports L flank pain, states it started on Saturday, does have bruising to the area, reports he is coumadin for blood clots, denies melena or hematuria, denies injury to the area

## 2020-08-28 ENCOUNTER — Ambulatory Visit: Payer: BC Managed Care – PPO | Admitting: Cardiology

## 2020-09-04 ENCOUNTER — Ambulatory Visit (INDEPENDENT_AMBULATORY_CARE_PROVIDER_SITE_OTHER): Payer: BC Managed Care – PPO

## 2020-09-04 ENCOUNTER — Other Ambulatory Visit: Payer: Self-pay

## 2020-09-04 DIAGNOSIS — Z5181 Encounter for therapeutic drug level monitoring: Secondary | ICD-10-CM | POA: Diagnosis not present

## 2020-09-04 DIAGNOSIS — I6322 Cerebral infarction due to unspecified occlusion or stenosis of basilar arteries: Secondary | ICD-10-CM | POA: Diagnosis not present

## 2020-09-04 DIAGNOSIS — I513 Intracardiac thrombosis, not elsewhere classified: Secondary | ICD-10-CM | POA: Diagnosis not present

## 2020-09-04 LAB — POCT INR: INR: 1.3 — AB (ref 2.0–3.0)

## 2020-09-04 NOTE — Patient Instructions (Signed)
Take 3 tablets tonight only and then Continue 2 tablets daily. Recheck INR 4 weeks. [facility]: [phone].

## 2020-09-12 ENCOUNTER — Other Ambulatory Visit: Payer: Self-pay | Admitting: Cardiovascular Disease

## 2020-09-22 ENCOUNTER — Other Ambulatory Visit (HOSPITAL_COMMUNITY): Payer: Self-pay | Admitting: Interventional Radiology

## 2020-09-22 DIAGNOSIS — I639 Cerebral infarction, unspecified: Secondary | ICD-10-CM

## 2020-09-23 ENCOUNTER — Telehealth: Payer: Self-pay

## 2020-09-23 NOTE — Telephone Encounter (Signed)
   [facility] Pre-operative Risk Assessment    Patient Name: [name]  DOB: 1970/11/26  MRN: [id number]   HEARTCARE STAFF: - Please ensure there is not already an duplicate clearance open for this procedure. - Under Visit Info/Reason for Call, type in Other and utilize the format Clearance MM/DD/YY or Clearance TBD. Do not use dashes or single digits. - If request is for dental extraction, please clarify the # of teeth to be extracted. - If the patient is currently at the dentist's office, call Pre-Op APP to address. If the patient is not currently in the dentist office, please route to the Pre-Op pool  Request for surgical clearance:  What type of surgery is being performed? Diagnostic angiogram  When is this surgery scheduled? 10/08/2020   What type of clearance is required (medical clearance vs. Pharmacy clearance to hold med vs. Both)? Both  Are there any medications that need to be held prior to surgery and how long? Warfarin for 4 days prior    Practice name and name of physician performing surgery? [facility] Dr. Estanislado Pandy   What is the office phone number? [phone]   7.   What is the office fax number? [phone]  8.   Anesthesia type (None, local, MAC, general) ? None   Gita Kudo 09/23/2020, 4:39 PM  _________________________________________________________________   (provider comments below)

## 2020-09-24 NOTE — Telephone Encounter (Signed)
Name: [name]  DOB: 1970-04-24  MRN: [id number]   Primary Cardiologist: Chrystie Nose, MD  Chart reviewed as part of pre-operative protocol coverage. Patient was contacted 09/24/2020 in reference to pre-operative risk assessment for pending surgery as outlined below.  [name] was last seen on 02/28/20 by Dr. Dulce Sellar.  Since that day, [name] has done well. 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   Per our clinical pharmacist: Request is to hold warfarin for 4 days prior to procedure. Pt will require bridging with Lovenox in order to hold his warfarin. He is followed in the [facility] and next INR check is on 7/7. Will coordinate Lovenox bridge at that visit. Pt will qualify for Lovenox 120mg  BID dosing.  Therefore, based on ACC/AHA guidelines, the patient would be at acceptable risk for the planned procedure without further cardiovascular testing.   The patient was advised that if he develops new symptoms prior to surgery to contact our office to arrange for a follow-up visit, and he verbalized understanding.  I will route this recommendation to the requesting party via Epic fax function and remove from pre-op pool. Please call with questions.  Jon Rutherford Jarelis Ehlert, PA 09/24/2020, 2:28 PM

## 2020-09-24 NOTE — Telephone Encounter (Signed)
Patient with hx of multiple venous occlusions, previously on Eliquis. Stopped his Eliquis and 2 days later presented 12/28/19 with embolic stroke s/p thrombectomy, secondary to LV thrombus (LV thrombus noted 10/2019 chest CT, pt already on Eliquis due to venous occlusions at the time. During 12/2019 admission, CTA showed continued presence of LV thrombus). MRI showed numerous acute infarcts. He was discharged on warfarin with Lovenox bridge, INR range now 2-2.5.  Procedure: diagnostic angiogram Date of procedure: 10/08/20   CrCl >160mL/min Platelet count 249K  Request is to hold warfarin for 4 days prior to procedure. Pt will require bridging with Lovenox in order to hold his warfarin. He is followed in the [facility] and next INR check is on 7/7. Will coordinate Lovenox bridge at that visit. Pt will qualify for Lovenox 120mg  BID dosing.

## 2020-09-24 NOTE — Telephone Encounter (Signed)
Pt on coumadin for mural thrombus. Can you please comment on coumadin hold?

## 2020-10-01 ENCOUNTER — Telehealth: Payer: Self-pay

## 2020-10-01 NOTE — Telephone Encounter (Signed)
Lmom to R/s missed appt  ?

## 2020-10-05 ENCOUNTER — Other Ambulatory Visit: Payer: Self-pay

## 2020-10-05 ENCOUNTER — Ambulatory Visit (INDEPENDENT_AMBULATORY_CARE_PROVIDER_SITE_OTHER): Payer: BC Managed Care – PPO

## 2020-10-05 DIAGNOSIS — Z5181 Encounter for therapeutic drug level monitoring: Secondary | ICD-10-CM | POA: Diagnosis not present

## 2020-10-05 DIAGNOSIS — I6322 Cerebral infarction due to unspecified occlusion or stenosis of basilar arteries: Secondary | ICD-10-CM | POA: Diagnosis not present

## 2020-10-05 DIAGNOSIS — I513 Intracardiac thrombosis, not elsewhere classified: Secondary | ICD-10-CM

## 2020-10-05 LAB — POCT INR: INR: 1.5 — AB (ref 2.0–3.0)

## 2020-10-05 MED ORDER — ENOXAPARIN SODIUM 120 MG/0.8ML IJ SOSY: 120.0000 mg | PREFILLED_SYRINGE | Freq: Two times a day (BID) | INTRAMUSCULAR | 1 refills | Status: DC

## 2020-10-05 NOTE — Patient Instructions (Signed)
HOLD Coumadin for 7/14 procedure/Lovenox Bridging; Continue 2 tablets daily. Recheck INR 1 week. [facility]: [phone].    7/11: Inject enoxaparin 120mg  in the fatty abdominal tissue at least 2 inches from the belly button twice a day about 12 hours apart,  8pm rotate sites. No warfarin.  7/12: Inject enoxaparin in the fatty tissue every 12 hours, 8am and 8pm. No warfarin.  7/13: Inject enoxaparin in the fatty tissue in the morning at 8 am (No PM dose). No warfarin.  7/14: Procedure Day - No enoxaparin - Resume warfarin in the evening or as directed by doctor (take an extra half tablet with usual dose for 2 days then resume normal dose).  7/15: Resume enoxaparin inject in the fatty tissue every 12 hours and take warfarin  7/16: Inject enoxaparin in the fatty tissue every 12 hours and take warfarin  7/17: Inject enoxaparin in the fatty tissue every 12 hours and take warfarin  7/18: Inject enoxaparin in the fatty tissue every 12 hours and take warfarin  7/19: Inject enoxaparin in the fatty tissue every 12 hours and take warfarin  7/20: Take enoxaparin AM Dose; warfarin appt to check INR.

## 2020-10-07 ENCOUNTER — Other Ambulatory Visit: Payer: Self-pay | Admitting: Student

## 2020-10-08 ENCOUNTER — Other Ambulatory Visit: Payer: Self-pay

## 2020-10-08 ENCOUNTER — Ambulatory Visit (HOSPITAL_COMMUNITY)
Admission: RE | Admit: 2020-10-08 | Discharge: 2020-10-08 | Disposition: A | Discharge status: Home / Self Care | Payer: BC Managed Care – PPO | Source: Ambulatory Visit | Attending: Interventional Radiology | Admitting: Interventional Radiology

## 2020-10-08 ENCOUNTER — Ambulatory Visit: Payer: BC Managed Care – PPO | Admitting: Cardiology

## 2020-10-08 ENCOUNTER — Encounter (HOSPITAL_COMMUNITY): Payer: Self-pay

## 2020-10-08 DIAGNOSIS — I639 Cerebral infarction, unspecified: Secondary | ICD-10-CM

## 2020-10-09 ENCOUNTER — Encounter: Payer: Self-pay | Admitting: Cardiology

## 2020-10-09 ENCOUNTER — Ambulatory Visit (INDEPENDENT_AMBULATORY_CARE_PROVIDER_SITE_OTHER): Payer: BC Managed Care – PPO | Admitting: Cardiology

## 2020-10-09 VITALS — BP 126/82 | HR 106 | Ht 69.0 in | Wt 254.4 lb

## 2020-10-09 DIAGNOSIS — I6322 Cerebral infarction due to unspecified occlusion or stenosis of basilar arteries: Secondary | ICD-10-CM

## 2020-10-09 DIAGNOSIS — I513 Intracardiac thrombosis, not elsewhere classified: Secondary | ICD-10-CM

## 2020-10-09 DIAGNOSIS — Z8673 Personal history of transient ischemic attack (TIA), and cerebral infarction without residual deficits: Secondary | ICD-10-CM | POA: Diagnosis not present

## 2020-10-09 DIAGNOSIS — E785 Hyperlipidemia, unspecified: Secondary | ICD-10-CM

## 2020-10-09 DIAGNOSIS — I639 Cerebral infarction, unspecified: Secondary | ICD-10-CM | POA: Insufficient documentation

## 2020-10-09 DIAGNOSIS — I2510 Atherosclerotic heart disease of native coronary artery without angina pectoris: Secondary | ICD-10-CM | POA: Diagnosis not present

## 2020-10-09 DIAGNOSIS — F17200 Nicotine dependence, unspecified, uncomplicated: Secondary | ICD-10-CM

## 2020-10-09 DIAGNOSIS — I10 Essential (primary) hypertension: Secondary | ICD-10-CM | POA: Diagnosis not present

## 2020-10-09 DIAGNOSIS — E669 Obesity, unspecified: Secondary | ICD-10-CM
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
## 2020-10-09 NOTE — Patient Instructions (Signed)
Medication Instructions:  Your physician recommends that you continue on your current medications as directed. Please refer to the Current Medication list given to you today.  *If you need a refill on your cardiac medications before your next appointment, please call your pharmacy*   Lab Work: Your physician recommends that you return for lab work in:  TODAY: BMET, Mag, INR, HbgA1C If you have labs (blood work) drawn today and your tests are completely normal, you will receive your results only by: MyChart Message (if you have MyChart) OR A paper copy in the mail If you have any lab test that is abnormal or we need to change your treatment, we will call you to review the results.   Testing/Procedures: None   Follow-Up: At [facility], you and your health needs are our priority.  As part of our continuing mission to provide you with exceptional heart care, we have created designated Provider Care Teams.  These Care Teams include your primary Cardiologist (physician) and Advanced Practice Providers (APPs -  Physician Assistants and Nurse Practitioners) who all work together to provide you with the care you need, when you need it.  We recommend signing up for the patient portal called "MyChart".  Sign up information is provided on this After Visit Summary.  MyChart is used to connect with patients for Virtual Visits (Telemedicine).  Patients are able to view lab/test results, encounter notes, upcoming appointments, etc.  Non-urgent messages can be sent to your provider as well.   To learn more about what you can do with MyChart, go to [email or web address].    Your next appointment:   6 month(s)  The format for your next appointment:   In Person  Provider:   Norman Herrlich, MD   Other Instructions

## 2020-10-10 LAB — BASIC METABOLIC PANEL
BUN/Creatinine Ratio: 18 (ref 9–20)
BUN: 18 mg/dL (ref 6–24)
CO2: 21 mmol/L (ref 20–29)
Calcium: 9.6 mg/dL (ref 8.7–10.2)
Chloride: 97 mmol/L (ref 96–106)
Creatinine, Ser: 1.02 mg/dL (ref 0.76–1.27)
Glucose: 106 mg/dL — ABNORMAL HIGH (ref 65–99)
Potassium: 3.8 mmol/L (ref 3.5–5.2)
Sodium: 140 mmol/L (ref 134–144)
eGFR: 90 mL/min/{1.73_m2} (ref >59)

## 2020-10-10 LAB — HEMOGLOBIN A1C
Est. average glucose Bld gHb Est-mCnc: 123 mg/dL
Hgb A1c MFr Bld: 5.9 % — ABNORMAL HIGH (ref 4.8–5.6)

## 2020-10-10 LAB — PROTIME-INR
INR: 1 (ref 0.9–1.2)
Prothrombin Time: 10.1 s (ref 9.1–12.0)

## 2020-10-10 LAB — MAGNESIUM: Magnesium: 2.3 mg/dL (ref 1.6–2.3)

## 2020-10-12 ENCOUNTER — Telehealth: Payer: Self-pay

## 2020-10-12 NOTE — Telephone Encounter (Signed)
Spoke with patient regarding results and recommendation.  Patient verbalizes understanding and is agreeable to plan of care. Advised patient to call back with any issues or concerns.  

## 2020-10-12 NOTE — Telephone Encounter (Signed)
-----   Message from Baldo Daub, MD sent at 10/11/2020  7:18 PM EDT ----- Good result except as INR is not therapeutic Dr. Mallory Shirk notes that he was transition Lovenox to Coumadin and is to follow-up in clinic on Wednesday.

## 2020-10-23 ENCOUNTER — Ambulatory Visit (INDEPENDENT_AMBULATORY_CARE_PROVIDER_SITE_OTHER): Payer: BC Managed Care – PPO

## 2020-10-23 ENCOUNTER — Other Ambulatory Visit: Payer: Self-pay

## 2020-10-23 DIAGNOSIS — Z5181 Encounter for therapeutic drug level monitoring: Secondary | ICD-10-CM

## 2020-10-23 DIAGNOSIS — I6322 Cerebral infarction due to unspecified occlusion or stenosis of basilar arteries: Secondary | ICD-10-CM

## 2020-10-23 DIAGNOSIS — I513 Intracardiac thrombosis, not elsewhere classified: Secondary | ICD-10-CM | POA: Diagnosis not present

## 2020-10-23 LAB — POCT INR: INR: 1.4 — AB (ref 2.0–3.0)

## 2020-10-23 NOTE — Patient Instructions (Signed)
Take 3 tablets tonight only and then increase to 2 tablets daily, except 3 tablets on Wednesday. Recheck INR 1 week. [facility]: [phone].

## 2020-10-30 ENCOUNTER — Ambulatory Visit (INDEPENDENT_AMBULATORY_CARE_PROVIDER_SITE_OTHER): Payer: BC Managed Care – PPO

## 2020-10-30 ENCOUNTER — Other Ambulatory Visit: Payer: Self-pay

## 2020-10-30 DIAGNOSIS — I513 Intracardiac thrombosis, not elsewhere classified: Secondary | ICD-10-CM

## 2020-10-30 DIAGNOSIS — I6322 Cerebral infarction due to unspecified occlusion or stenosis of basilar arteries: Secondary | ICD-10-CM

## 2020-10-30 DIAGNOSIS — Z5181 Encounter for therapeutic drug level monitoring: Secondary | ICD-10-CM | POA: Diagnosis not present

## 2020-10-30 LAB — POCT INR: INR: 3.1 — AB (ref 2.0–3.0)

## 2020-10-30 NOTE — Patient Instructions (Signed)
Hold tonight only and then continue taking 2 tablets daily, except 3 tablets on Wednesday. Recheck INR 3 weeks. [facility]: [phone].

## 2020-11-05 ENCOUNTER — Ambulatory Visit (INDEPENDENT_AMBULATORY_CARE_PROVIDER_SITE_OTHER): Payer: BC Managed Care – PPO | Admitting: Adult Health

## 2020-11-05 ENCOUNTER — Encounter: Payer: Self-pay | Admitting: Adult Health

## 2020-11-05 ENCOUNTER — Other Ambulatory Visit: Payer: Self-pay

## 2020-11-05 VITALS — BP 129/85 | HR 72 | Ht 69.0 in | Wt 274.0 lb

## 2020-11-05 DIAGNOSIS — E538 Deficiency of other specified B group vitamins: Secondary | ICD-10-CM

## 2020-11-05 DIAGNOSIS — H547 Unspecified visual loss: Secondary | ICD-10-CM | POA: Diagnosis not present

## 2020-11-05 DIAGNOSIS — I69398 Other sequelae of cerebral infarction: Secondary | ICD-10-CM

## 2020-11-05 DIAGNOSIS — I6322 Cerebral infarction due to unspecified occlusion or stenosis of basilar arteries: Secondary | ICD-10-CM | POA: Diagnosis not present
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
## 2020-11-05 NOTE — Patient Instructions (Addendum)
Continue Brilinta (ticagrelor) 90 mg bid and warfarin daily  and atorvastatin  for secondary stroke prevention  Continue to follow up with PCP regarding cholesterol and blood pressure management  Maintain strict control of hypertension with blood pressure goal below 130/90 and cholesterol with LDL cholesterol (bad cholesterol) goal below 70 mg/dL.   Please contact Dr. Fatima Sanger office to schedule recommended cerebral angiogram at [phone]. I will also try to reach out to them to see if they can contact you.      Followup in the future with me in 6 months or call earlier if needed       Thank you for coming to see [place] at [facility]. I hope we have been able to provide you high quality care today.  You may receive a patient satisfaction survey over the next few weeks. We would appreciate your feedback and comments so that we may continue to improve ourselves and the health of our patients.

## 2020-11-09 NOTE — Progress Notes (Signed)
I agree with the above plan 

## 2020-11-17 NOTE — Telephone Encounter (Signed)
   Name: [name]  DOB: 05-27-1970  MRN: [id number]   Primary Cardiologist: Norman Herrlich, MD  Chart revisited as part of pre-operative protocol coverage per request below - procedure has been rescheduled.  Patient was contacted 11/17/2020 in reference to pre-operative risk assessment for pending surgery as outlined below.  [name] was last seen on 10/09/20 by Dr. Servando Vargas. I reached out to patient for update on how he is doing. The patient affirms he has been doing well without any new cardiac symptoms. 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 Therefore, based on ACC/AHA guidelines, the patient would be at acceptable risk for the planned procedure without further cardiovascular testing. The patient was advised that if he develops new symptoms prior to surgery to contact our office to arrange for a follow-up visit, and he verbalized understanding.  The patient has a Coumadin clinic appointment on Friday so I will route this message to pharmacy team so they can help coordinate his Lovenox-Coumadin bridging plans going forward.  I will route this recommendation to the requesting party via Epic fax function and remove from pre-op pool. Please call with questions.  Jon Montana, PA-C 11/17/2020, 4:20 PM

## 2020-11-17 NOTE — Telephone Encounter (Signed)
[name] is calling stating this procedure has been rescheduled for 12/17/20. She is wanting the patient to be called to go back over the instructions.

## 2020-11-20 ENCOUNTER — Other Ambulatory Visit: Payer: Self-pay

## 2020-11-20 ENCOUNTER — Ambulatory Visit (INDEPENDENT_AMBULATORY_CARE_PROVIDER_SITE_OTHER): Payer: BC Managed Care – PPO

## 2020-11-20 DIAGNOSIS — I513 Intracardiac thrombosis, not elsewhere classified: Secondary | ICD-10-CM

## 2020-11-20 DIAGNOSIS — Z5181 Encounter for therapeutic drug level monitoring: Secondary | ICD-10-CM

## 2020-11-20 LAB — POCT INR: INR: 1.3 — AB (ref 2.0–3.0)

## 2020-11-20 NOTE — Patient Instructions (Addendum)
Description   Take 3 tablets tonight and 3 tablets tomorrow and then continue taking 2 tablets daily, except 3 tablets on Wednesday. Recheck INR 1 week. [facility]: [phone].      9/17:Last dose of Warfarin   9/18:No warfarin or enoxaparin (Lovenox).nject enoxaparin 120mg  in the fatty abdominal tissue at least 2 inches from the belly button twice a day about 12 hours apart, 8am and 8pm rotate sites. No warfarin.  9/19: Inject enoxaparin 120mg  in the fatty abdominal tissue at least 2 inches from the belly button twice a day about 12 hours apart, 8am and 8pm rotate sites. No warfarin.  9/20: Inject enoxaparin in the fatty tissue every 12 hours, 8am and 8pm. No warfarin.  9/21: Inject enoxaparin in the fatty tissue in the morning at 8 am (No PM dose). No warfarin.  9/22: Procedure Day - No enoxaparin - Resume warfarin in the evening or as directed by doctor (take an extra half tablet with usual dose for 2 days then resume normal dose).  9/23: Resume enoxaparin inject in the fatty tissue every 12 hours and take warfarin  9/24: Inject enoxaparin in the fatty tissue every 12 hours and take warfarin  9/25: Inject enoxaparin in the fatty tissue every 12 hours and take warfarin  9/26: Inject enoxaparin in the fatty tissue every 12 hours and take warfarin  9/27: Inject enoxaparin in the fatty tissue every 12 hours and take warfarin  9/28: warfarin appt to check INR.

## 2020-11-27 ENCOUNTER — Telehealth: Payer: Self-pay

## 2020-11-27 NOTE — Telephone Encounter (Signed)
Lmom for overdue inr 

## 2020-12-08 ENCOUNTER — Ambulatory Visit (INDEPENDENT_AMBULATORY_CARE_PROVIDER_SITE_OTHER): Payer: BC Managed Care – PPO

## 2020-12-08 ENCOUNTER — Other Ambulatory Visit: Payer: Self-pay

## 2020-12-08 DIAGNOSIS — I513 Intracardiac thrombosis, not elsewhere classified: Secondary | ICD-10-CM

## 2020-12-08 DIAGNOSIS — Z5181 Encounter for therapeutic drug level monitoring: Secondary | ICD-10-CM

## 2020-12-08 LAB — POCT INR: INR: 1.2 — AB (ref 2.0–3.0)

## 2020-12-08 MED ORDER — ENOXAPARIN SODIUM 120 MG/0.8ML IJ SOSY: 120.0000 mg | PREFILLED_SYRINGE | Freq: Two times a day (BID) | INTRAMUSCULAR | 1 refills | Status: DC

## 2020-12-08 MED ORDER — WARFARIN SODIUM 5 MG PO TABS: ORAL_TABLET | ORAL | 1 refills | Status: DC

## 2020-12-08 NOTE — Telephone Encounter (Signed)
Prescription refill request received for warfarin Lov: 10/09/20 (Tobb, DO)  Next INR check: 12/23/20  Warfarin tablet strength: 5mg   Appropriate dose and refill sent to requested pharmacy

## 2020-12-08 NOTE — Patient Instructions (Addendum)
Description   Take 3 tablets today and 3.5 tablets tomorrow and then continue taking 2 tablets daily, except 3 tablets on Wednesday. Recheck INR on 12/23/20 [facility]: [phone].        9/17:Last dose of Warfarin    9/18:No warfarin or enoxaparin (Lovenox).   9/19: Inject enoxaparin 120mg  in the fatty abdominal tissue at least 2 inches from the belly button twice a day about 12 hours apart, 8am and 8pm rotate sites. No warfarin.   9/20: Inject enoxaparin in the fatty tissue every 12 hours, 8am and 8pm. No warfarin.   9/21: Inject enoxaparin in the fatty tissue in the morning at 8 am (No PM dose). No warfarin.   9/22: Procedure Day - No enoxaparin - Resume warfarin in the evening or as directed by doctor (take an extra half tablet with usual dose for 2 days then resume normal dose).   9/23: Resume enoxaparin inject in the fatty tissue every 12 hours and take warfarin   9/24: Inject enoxaparin in the fatty tissue every 12 hours and take warfarin   9/25: Inject enoxaparin in the fatty tissue every 12 hours and take warfarin   9/26: Inject enoxaparin in the fatty tissue every 12 hours and take warfarin   9/27: Inject enoxaparin in the fatty tissue every 12 hours and take warfarin   9/28: warfarin appt to check INR

## 2020-12-15 ENCOUNTER — Other Ambulatory Visit: Payer: Self-pay | Admitting: Radiology

## 2020-12-16 ENCOUNTER — Other Ambulatory Visit: Payer: Self-pay | Admitting: Radiology

## 2020-12-17 ENCOUNTER — Encounter (HOSPITAL_COMMUNITY): Payer: Self-pay

## 2020-12-17 ENCOUNTER — Other Ambulatory Visit: Payer: Self-pay

## 2020-12-17 ENCOUNTER — Other Ambulatory Visit (HOSPITAL_COMMUNITY): Payer: Self-pay | Admitting: Interventional Radiology

## 2020-12-17 ENCOUNTER — Ambulatory Visit (HOSPITAL_COMMUNITY)
Admission: RE | Admit: 2020-12-17 | Discharge: 2020-12-17 | Disposition: A | Discharge status: Home / Self Care | Payer: BC Managed Care – PPO | Source: Ambulatory Visit | Attending: Interventional Radiology | Admitting: Interventional Radiology

## 2020-12-17 DIAGNOSIS — I639 Cerebral infarction, unspecified: Secondary | ICD-10-CM

## 2020-12-17 HISTORY — PX: IR ANGIO VERTEBRAL SEL VERTEBRAL UNI L MOD SED: IMG5367

## 2020-12-17 HISTORY — PX: IR ANGIO INTRA EXTRACRAN SEL COM CAROTID INNOMINATE UNI L MOD SED: IMG5358

## 2020-12-17 LAB — CBC WITH DIFFERENTIAL/PLATELET
Abs Immature Granulocytes: 0.11 10*3/uL — ABNORMAL HIGH (ref 0.00–0.07)
Basophils Absolute: 0.1 10*3/uL (ref 0.0–0.1)
Basophils Relative: 1 %
Eosinophils Absolute: 0.2 10*3/uL (ref 0.0–0.5)
Eosinophils Relative: 2 %
HCT: 48.2 % (ref 39.0–52.0)
Hemoglobin: 16.2 g/dL (ref 13.0–17.0)
Immature Granulocytes: 1 %
Lymphocytes Relative: 25 %
Lymphs Abs: 2.2 10*3/uL (ref 0.7–4.0)
MCH: 31.2 pg (ref 26.0–34.0)
MCHC: 33.6 g/dL (ref 30.0–36.0)
MCV: 92.7 fL (ref 80.0–100.0)
Monocytes Absolute: 0.7 10*3/uL (ref 0.1–1.0)
Monocytes Relative: 8 %
Neutro Abs: 5.6 10*3/uL (ref 1.7–7.7)
Neutrophils Relative %: 63 %
Platelets: 217 10*3/uL (ref 150–400)
RBC: 5.2 MIL/uL (ref 4.22–5.81)
RDW: 13.4 % (ref 11.5–15.5)
WBC: 9 10*3/uL (ref 4.0–10.5)
nRBC: 0 % (ref 0.0–0.2)

## 2020-12-17 LAB — PROTIME-INR
INR: 0.9 (ref 0.8–1.2)
Prothrombin Time: 12.5 seconds (ref 11.4–15.2)

## 2020-12-17 LAB — BASIC METABOLIC PANEL
Anion gap: 10 (ref 5–15)
BUN: 12 mg/dL (ref 6–20)
CO2: 25 mmol/L (ref 22–32)
Calcium: 8.9 mg/dL (ref 8.9–10.3)
Chloride: 100 mmol/L (ref 98–111)
Creatinine, Ser: 0.93 mg/dL (ref 0.61–1.24)
GFR, Estimated: >60 mL/min (ref >60)
Glucose, Bld: 100 mg/dL — ABNORMAL HIGH (ref 70–99)
Potassium: 4.4 mmol/L (ref 3.5–5.1)
Sodium: 135 mmol/L (ref 135–145)

## 2020-12-17 MED ORDER — SODIUM CHLORIDE 0.9 % IV SOLN: INTRAVENOUS | Status: AC

## 2020-12-17 MED ORDER — LIDOCAINE HCL 1 % IJ SOLN
INTRAMUSCULAR | Status: AC
  Filled 2020-12-17: qty 20

## 2020-12-17 MED ORDER — MIDAZOLAM HCL 2 MG/2ML IJ SOLN
INTRAMUSCULAR | Status: AC
  Filled 2020-12-17: qty 2

## 2020-12-17 MED ORDER — VERAPAMIL HCL 2.5 MG/ML IV SOLN
INTRAVENOUS | Status: AC
  Filled 2020-12-17: qty 2

## 2020-12-17 MED ORDER — SODIUM CHLORIDE 0.9 % IV SOLN: INTRAVENOUS | Status: DC

## 2020-12-17 MED ORDER — MIDAZOLAM HCL 2 MG/2ML IJ SOLN
INTRAMUSCULAR | Status: DC | PRN
  Administered 2020-12-17: 1 mg via INTRAVENOUS

## 2020-12-17 MED ORDER — HEPARIN SODIUM (PORCINE) 1000 UNIT/ML IJ SOLN
INTRAMUSCULAR | Status: DC | PRN
  Administered 2020-12-17: 1000 [IU] via INTRAVENOUS

## 2020-12-17 MED ORDER — FENTANYL CITRATE (PF) 100 MCG/2ML IJ SOLN
INTRAMUSCULAR | Status: AC
  Filled 2020-12-17: qty 2

## 2020-12-17 MED ORDER — HEPARIN SODIUM (PORCINE) 1000 UNIT/ML IJ SOLN
INTRAMUSCULAR | Status: AC
  Filled 2020-12-17: qty 1

## 2020-12-17 MED ORDER — IOHEXOL 240 MG/ML SOLN
100.0000 mL | Freq: Once | INTRAMUSCULAR | Status: AC | PRN
  Administered 2020-12-17: 30 mL via INTRA_ARTERIAL

## 2020-12-17 MED ORDER — FENTANYL CITRATE (PF) 100 MCG/2ML IJ SOLN
INTRAMUSCULAR | Status: DC | PRN
  Administered 2020-12-17: 25 ug via INTRAVENOUS

## 2020-12-17 MED ORDER — LIDOCAINE HCL (PF) 1 % IJ SOLN
INTRAMUSCULAR | Status: DC | PRN
  Administered 2020-12-17: 10 mL
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
## 2020-12-17 NOTE — Progress Notes (Signed)
Pt ambulated without difficulty or bleeding.   Discharged home with his stepfather who will drive and stay with pt x 24 hrs.

## 2020-12-17 NOTE — Sedation Documentation (Signed)
Right femoral sheath removed, exoseal closure device deployed to right groin.

## 2020-12-17 NOTE — Procedures (Signed)
S/P Lt VA and Lt common carotid arteriogra. RT CFA approach. Findings. 1.Widely patent basilar stent without intrastent stenosis. S.Eldena Dede MD

## 2020-12-23 ENCOUNTER — Ambulatory Visit (INDEPENDENT_AMBULATORY_CARE_PROVIDER_SITE_OTHER): Payer: BC Managed Care – PPO

## 2020-12-23 ENCOUNTER — Other Ambulatory Visit: Payer: Self-pay

## 2020-12-23 DIAGNOSIS — Z5181 Encounter for therapeutic drug level monitoring: Secondary | ICD-10-CM

## 2020-12-23 DIAGNOSIS — I513 Intracardiac thrombosis, not elsewhere classified: Secondary | ICD-10-CM

## 2020-12-23 DIAGNOSIS — I6322 Cerebral infarction due to unspecified occlusion or stenosis of basilar arteries: Secondary | ICD-10-CM | POA: Diagnosis not present

## 2020-12-23 LAB — POCT INR: INR: 1.4 — AB (ref 2.0–3.0)

## 2020-12-23 NOTE — Patient Instructions (Signed)
Take 4 tablets today and 2.5 tablets tomorrow and then continue taking 2 tablets daily, except 3 tablets on Wednesday.  Lovenox injection tonight and tomorrow morning. Recheck INR on 10/4  [facility]: [phone].

## 2020-12-29 ENCOUNTER — Other Ambulatory Visit: Payer: Self-pay

## 2020-12-29 ENCOUNTER — Ambulatory Visit (INDEPENDENT_AMBULATORY_CARE_PROVIDER_SITE_OTHER): Payer: BC Managed Care – PPO | Admitting: *Deleted

## 2020-12-29 DIAGNOSIS — I513 Intracardiac thrombosis, not elsewhere classified: Secondary | ICD-10-CM | POA: Diagnosis not present

## 2020-12-29 DIAGNOSIS — Z5181 Encounter for therapeutic drug level monitoring: Secondary | ICD-10-CM

## 2020-12-29 DIAGNOSIS — I6322 Cerebral infarction due to unspecified occlusion or stenosis of basilar arteries: Secondary | ICD-10-CM

## 2020-12-29 LAB — POCT INR: INR: 1.4 — AB (ref 2.0–3.0)

## 2020-12-29 NOTE — Patient Instructions (Signed)
Description   Continue lovenox injections, twice a day. Take 4 tablet of warfarin today, then start taking warfarin 2 tablets daily excpet 3 tablets on Sunday and  Wednesdays. Recheck INR on Friday.  [facility] [phone]

## 2021-01-01 ENCOUNTER — Other Ambulatory Visit: Payer: Self-pay

## 2021-01-01 ENCOUNTER — Ambulatory Visit (INDEPENDENT_AMBULATORY_CARE_PROVIDER_SITE_OTHER): Payer: BC Managed Care – PPO

## 2021-01-01 DIAGNOSIS — I6322 Cerebral infarction due to unspecified occlusion or stenosis of basilar arteries: Secondary | ICD-10-CM | POA: Diagnosis not present

## 2021-01-01 DIAGNOSIS — Z5181 Encounter for therapeutic drug level monitoring: Secondary | ICD-10-CM | POA: Diagnosis not present

## 2021-01-01 DIAGNOSIS — I513 Intracardiac thrombosis, not elsewhere classified: Secondary | ICD-10-CM

## 2021-01-01 LAB — POCT INR: INR: 2 (ref 2.0–3.0)

## 2021-01-01 NOTE — Patient Instructions (Signed)
Continue taking warfarin 2 tablets daily except 3 tablets on Sunday and  Wednesdays. Recheck INR 6 weeks.  [facility] [phone]

## 2021-01-13 ENCOUNTER — Telehealth: Payer: Self-pay

## 2021-01-13 NOTE — Telephone Encounter (Signed)
Called and lmom pt that the [employer] in[employer] monitoring company has apparently been trying to contact them to schedule training and delivery of machine to them I instructed the pt to call [phone] asap to get them started.

## 2021-01-23 LAB — POCT INR: INR: 1.4 — AB (ref 2.0–3.0)

## 2021-01-25 ENCOUNTER — Ambulatory Visit (INDEPENDENT_AMBULATORY_CARE_PROVIDER_SITE_OTHER): Payer: BC Managed Care – PPO | Admitting: Cardiology

## 2021-01-25 DIAGNOSIS — I6322 Cerebral infarction due to unspecified occlusion or stenosis of basilar arteries: Secondary | ICD-10-CM

## 2021-01-25 DIAGNOSIS — Z5181 Encounter for therapeutic drug level monitoring: Secondary | ICD-10-CM

## 2021-01-25 DIAGNOSIS — I513 Intracardiac thrombosis, not elsewhere classified: Secondary | ICD-10-CM

## 2021-01-29 ENCOUNTER — Ambulatory Visit (INDEPENDENT_AMBULATORY_CARE_PROVIDER_SITE_OTHER): Payer: BC Managed Care – PPO | Admitting: Cardiology

## 2021-01-29 DIAGNOSIS — Z5181 Encounter for therapeutic drug level monitoring: Secondary | ICD-10-CM

## 2021-01-29 DIAGNOSIS — I6322 Cerebral infarction due to unspecified occlusion or stenosis of basilar arteries: Secondary | ICD-10-CM | POA: Diagnosis not present

## 2021-01-29 DIAGNOSIS — I513 Intracardiac thrombosis, not elsewhere classified: Secondary | ICD-10-CM

## 2021-01-29 LAB — POCT INR: INR: 1.4 — AB (ref 2.0–3.0)

## 2021-02-04 ENCOUNTER — Other Ambulatory Visit: Payer: Self-pay | Admitting: Cardiology

## 2021-02-08 ENCOUNTER — Telehealth: Payer: Self-pay | Admitting: *Deleted

## 2021-02-08 NOTE — Telephone Encounter (Signed)
Pt due to have INR check. Called pt and LMOM.  

## 2021-02-10 ENCOUNTER — Telehealth: Payer: Self-pay

## 2021-02-10 NOTE — Telephone Encounter (Signed)
Lpm to check INR 

## 2021-02-12 ENCOUNTER — Telehealth: Payer: Self-pay

## 2021-02-12 NOTE — Telephone Encounter (Signed)
Lpm to check INR 

## 2021-02-15 ENCOUNTER — Telehealth: Payer: Self-pay

## 2021-02-15 NOTE — Telephone Encounter (Signed)
Lpm to check INR 

## 2021-02-17 ENCOUNTER — Telehealth: Payer: Self-pay

## 2021-02-17 NOTE — Telephone Encounter (Signed)
Left message for patient to return the call about getting his INR checked.  

## 2021-02-17 NOTE — Telephone Encounter (Signed)
Lpm to check INR 

## 2021-02-17 NOTE — Telephone Encounter (Signed)
Left message for patient to return the call about getting his INR checked.

## 2021-02-25 ENCOUNTER — Telehealth: Payer: Self-pay

## 2021-02-25 NOTE — Telephone Encounter (Signed)
Lpm to check INR 

## 2021-02-26 ENCOUNTER — Telehealth: Payer: Self-pay

## 2021-02-26 NOTE — Telephone Encounter (Signed)
Lpm to check INR 

## 2021-03-01 ENCOUNTER — Telehealth: Payer: Self-pay

## 2021-03-01 NOTE — Telephone Encounter (Signed)
Lpm to check INR 

## 2021-03-03 ENCOUNTER — Telehealth: Payer: Self-pay

## 2021-03-03 NOTE — Telephone Encounter (Signed)
Lpm to check INR 

## 2021-03-04 ENCOUNTER — Telehealth: Payer: Self-pay

## 2021-03-04 NOTE — Telephone Encounter (Signed)
done

## 2021-03-04 NOTE — Telephone Encounter (Signed)
Lpm to check INR 

## 2021-03-10 ENCOUNTER — Telehealth: Payer: Self-pay

## 2021-03-10 NOTE — Telephone Encounter (Signed)
Lpm to check INR 

## 2021-03-11 LAB — POCT INR: INR: 2.1 (ref 2.0–3.0)

## 2021-03-12 ENCOUNTER — Ambulatory Visit (INDEPENDENT_AMBULATORY_CARE_PROVIDER_SITE_OTHER): Payer: BC Managed Care – PPO | Admitting: Cardiovascular Disease

## 2021-03-12 DIAGNOSIS — Z5181 Encounter for therapeutic drug level monitoring: Secondary | ICD-10-CM | POA: Diagnosis not present

## 2021-03-12 DIAGNOSIS — I513 Intracardiac thrombosis, not elsewhere classified: Secondary | ICD-10-CM

## 2021-03-12 DIAGNOSIS — I6322 Cerebral infarction due to unspecified occlusion or stenosis of basilar arteries: Secondary | ICD-10-CM | POA: Diagnosis not present

## 2021-03-28 LAB — POCT INR: INR: 2.3 (ref 2.0–3.0)

## 2021-03-31 ENCOUNTER — Ambulatory Visit (INDEPENDENT_AMBULATORY_CARE_PROVIDER_SITE_OTHER): Payer: BC Managed Care – PPO | Admitting: Internal Medicine

## 2021-03-31 ENCOUNTER — Telehealth: Payer: Self-pay

## 2021-03-31 DIAGNOSIS — I513 Intracardiac thrombosis, not elsewhere classified: Secondary | ICD-10-CM

## 2021-03-31 DIAGNOSIS — I6322 Cerebral infarction due to unspecified occlusion or stenosis of basilar arteries: Secondary | ICD-10-CM | POA: Diagnosis not present

## 2021-03-31 DIAGNOSIS — Z5181 Encounter for therapeutic drug level monitoring: Secondary | ICD-10-CM | POA: Diagnosis not present

## 2021-04-01 NOTE — Telephone Encounter (Signed)
done

## 2021-04-14 ENCOUNTER — Telehealth: Payer: Self-pay

## 2021-04-14 NOTE — Telephone Encounter (Signed)
Lpm to check INR 

## 2021-04-16 ENCOUNTER — Telehealth: Payer: Self-pay

## 2021-04-16 NOTE — Telephone Encounter (Signed)
Lpm to check INR 

## 2021-04-17 LAB — POCT INR: INR: 1.8 — AB (ref 2.0–3.0)

## 2021-04-19 ENCOUNTER — Ambulatory Visit (INDEPENDENT_AMBULATORY_CARE_PROVIDER_SITE_OTHER): Payer: BC Managed Care – PPO | Admitting: Cardiology

## 2021-04-19 DIAGNOSIS — I513 Intracardiac thrombosis, not elsewhere classified: Secondary | ICD-10-CM

## 2021-04-19 DIAGNOSIS — Z5181 Encounter for therapeutic drug level monitoring: Secondary | ICD-10-CM

## 2021-04-19 DIAGNOSIS — I6322 Cerebral infarction due to unspecified occlusion or stenosis of basilar arteries: Secondary | ICD-10-CM

## 2021-05-04 ENCOUNTER — Telehealth: Payer: Self-pay | Admitting: *Deleted

## 2021-05-04 NOTE — Telephone Encounter (Signed)
Pt is overdue to have INR check. Called pt and LMOM.

## 2021-05-06 ENCOUNTER — Telehealth: Payer: Self-pay

## 2021-05-06 NOTE — Telephone Encounter (Signed)
Lpm to check INR 

## 2021-05-10 ENCOUNTER — Telehealth: Payer: Self-pay

## 2021-05-10 NOTE — Telephone Encounter (Signed)
Lpmt check INR ?

## 2021-05-13 ENCOUNTER — Ambulatory Visit: Payer: BC Managed Care – PPO | Admitting: Adult Health

## 2021-05-13 ENCOUNTER — Other Ambulatory Visit: Payer: Self-pay

## 2021-05-13 ENCOUNTER — Encounter: Payer: Self-pay | Admitting: Adult Health

## 2021-05-13 VITALS — BP 152/100 | HR 99 | Ht 69.0 in | Wt 291.0 lb

## 2021-05-13 DIAGNOSIS — I6322 Cerebral infarction due to unspecified occlusion or stenosis of basilar arteries: Secondary | ICD-10-CM | POA: Diagnosis not present

## 2021-05-13 NOTE — Patient Instructions (Signed)
Continue warfarin daily  and atorvastatin  for secondary stroke prevention  Continue Brilinta per IR recommendations and ongoing follow up with Dr. Corliss Skains   Continue to follow up with PCP regarding cholesterol and blood pressure management  Maintain strict control of hypertension with blood pressure goal below 130/90 and cholesterol with LDL cholesterol (bad cholesterol) goal below 70 mg/dL.   Signs of a Stroke? Follow the BEFAST method:  Balance Watch for a sudden loss of balance, trouble with coordination or vertigo Eyes Is there a sudden loss of vision in one or both eyes? Or double vision?  Face: Ask the person to smile. Does one side of the face droop or is it numb?  Arms: Ask the person to raise both arms. Does one arm drift downward? Is there weakness or numbness of a leg? Speech: Ask the person to repeat a simple phrase. Does the speech sound slurred/strange? Is the person confused ? Time: If you observe any of these signs, call [phone].     Thank you for coming to see [place] at [facility]. I hope we have been able to provide you high quality care today.  You may receive a patient satisfaction survey over the next few weeks. We would appreciate your feedback and comments so that we may continue to improve ourselves and the health of our patients.
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
## 2021-05-20 ENCOUNTER — Telehealth: Payer: Self-pay

## 2021-05-20 NOTE — Telephone Encounter (Signed)
Lpm to check INR 

## 2021-05-31 ENCOUNTER — Telehealth: Payer: Self-pay

## 2021-05-31 NOTE — Telephone Encounter (Signed)
Lpm to check INR 

## 2021-06-01 ENCOUNTER — Telehealth: Payer: Self-pay

## 2021-06-01 NOTE — Telephone Encounter (Signed)
Lmom for overdue inr 

## 2021-06-02 ENCOUNTER — Encounter: Payer: Self-pay | Admitting: Cardiology

## 2021-06-02 ENCOUNTER — Ambulatory Visit (INDEPENDENT_AMBULATORY_CARE_PROVIDER_SITE_OTHER): Payer: BC Managed Care – PPO | Admitting: Cardiology

## 2021-06-02 ENCOUNTER — Other Ambulatory Visit: Payer: Self-pay

## 2021-06-02 VITALS — BP 132/98 | HR 96 | Ht 69.0 in | Wt 292.4 lb

## 2021-06-02 DIAGNOSIS — I6322 Cerebral infarction due to unspecified occlusion or stenosis of basilar arteries: Secondary | ICD-10-CM

## 2021-06-02 DIAGNOSIS — Z8673 Personal history of transient ischemic attack (TIA), and cerebral infarction without residual deficits: Secondary | ICD-10-CM | POA: Diagnosis not present

## 2021-06-02 DIAGNOSIS — I513 Intracardiac thrombosis, not elsewhere classified: Secondary | ICD-10-CM | POA: Diagnosis not present

## 2021-06-02 DIAGNOSIS — E782 Mixed hyperlipidemia: Secondary | ICD-10-CM

## 2021-06-02 DIAGNOSIS — I10 Essential (primary) hypertension: Secondary | ICD-10-CM | POA: Diagnosis not present
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
## 2021-06-02 NOTE — Patient Instructions (Signed)
Medication Instructions:  ?Your physician recommends that you continue on your current medications as directed. Please refer to the Current Medication list given to you today. ? ?*If you need a refill on your cardiac medications before your next appointment, please call your pharmacy* ? ? ?Lab Work: ?NONE ?If you have labs (blood work) drawn today and your tests are completely normal, you will receive your results only by: ?MyChart Message (if you have MyChart) OR ?A paper copy in the mail ?If you have any lab test that is abnormal or we need to change your treatment, we will call you to review the results. ? ? ?Testing/Procedures: ?NONE ? ? ?Follow-Up: ?At [facility], you and your health needs are our priority.  As part of our continuing mission to provide you with exceptional heart care, we have created designated Provider Care Teams.  These Care Teams include your primary Cardiologist (physician) and Advanced Practice Providers (APPs -  Physician Assistants and Nurse Practitioners) who all work together to provide you with the care you need, when you need it. ? ?We recommend signing up for the patient portal called "MyChart".  Sign up information is provided on this After Visit Summary.  MyChart is used to connect with patients for Virtual Visits (Telemedicine).  Patients are able to view lab/test results, encounter notes, upcoming appointments, etc.  Non-urgent messages can be sent to your provider as well.   ?To learn more about what you can do with MyChart, go to [email or web address].   ? ?Your next appointment:   ?6 month(s) ? ?The format for your next appointment:   ?In Person ? ?Provider:   ?Brian Munley, MD  ? ? ?Other Instructions ?  ?

## 2021-06-23 ENCOUNTER — Telehealth: Payer: Self-pay

## 2021-06-23 NOTE — Telephone Encounter (Signed)
Lpm to check INR 

## 2021-06-30 ENCOUNTER — Telehealth: Payer: Self-pay

## 2021-06-30 NOTE — Telephone Encounter (Signed)
Lpm to check INR 

## 2021-07-19 ENCOUNTER — Telehealth: Payer: Self-pay

## 2021-07-19 NOTE — Telephone Encounter (Signed)
Lpm to check INR 

## 2021-07-29 ENCOUNTER — Telehealth: Payer: Self-pay

## 2021-07-29 NOTE — Telephone Encounter (Signed)
Lpm to check INR 

## 2021-08-26 ENCOUNTER — Telehealth: Payer: Self-pay

## 2021-08-26 NOTE — Telephone Encounter (Signed)
Lpm to check INR 

## 2021-10-05 ENCOUNTER — Telehealth: Payer: Self-pay

## 2021-10-05 NOTE — Telephone Encounter (Signed)
Pt overdue for anticoagulation appt. Called pt, left message. Called pt's home and was able to speak with pt's mother. She stated pt was not at home but she would provide him with a message to call and schedule an appt as soon as possible.

## 2021-12-03 ENCOUNTER — Encounter: Payer: Self-pay | Admitting: Cardiology

## 2021-12-03 ENCOUNTER — Other Ambulatory Visit: Payer: Self-pay

## 2021-12-03 ENCOUNTER — Ambulatory Visit: Payer: BC Managed Care – PPO | Attending: Cardiology | Admitting: Cardiology

## 2021-12-03 VITALS — BP 178/118 | HR 100 | Ht 70.0 in | Wt 295.9 lb

## 2021-12-03 DIAGNOSIS — I10 Essential (primary) hypertension: Secondary | ICD-10-CM | POA: Diagnosis not present

## 2021-12-03 DIAGNOSIS — Z7901 Long term (current) use of anticoagulants: Secondary | ICD-10-CM | POA: Diagnosis not present

## 2021-12-03 DIAGNOSIS — I513 Intracardiac thrombosis, not elsewhere classified: Secondary | ICD-10-CM

## 2021-12-03 DIAGNOSIS — E782 Mixed hyperlipidemia: Secondary | ICD-10-CM

## 2021-12-03 DIAGNOSIS — Z8673 Personal history of transient ischemic attack (TIA), and cerebral infarction without residual deficits: Secondary | ICD-10-CM

## 2021-12-03 MED ORDER — TELMISARTAN-HCTZ 40-12.5 MG PO TABS: 1.0000 | ORAL_TABLET | Freq: Every day | ORAL | 3 refills | Status: DC
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
## 2021-12-03 NOTE — Addendum Note (Signed)
Addended by: Roxanne Mins I on: 12/03/2021 04:59 PM   Modules accepted: Orders

## 2021-12-03 NOTE — Patient Instructions (Signed)
Medication Instructions:  Your physician has recommended you make the following change in your medication:   STOP: Hydrochlorothiazide STOP: Losartan START: Telmisartan - HCTZ 40-12.5 mg daily  *If you need a refill on your cardiac medications before your next appointment, please call your pharmacy*   Lab Work: Your physician recommends that you return for lab work in:   Labs today: CMP, Lipid, Lpa, CBC  If you have labs (blood work) drawn today and your tests are completely normal, you will receive your results only by: MyChart Message (if you have MyChart) OR A paper copy in the mail If you have any lab test that is abnormal or we need to change your treatment, we will call you to review the results.   Testing/Procedures: Your physician has requested that you have an echocardiogram. Echocardiography is a painless test that uses sound waves to create images of your heart. It provides your doctor with information about the size and shape of your heart and how well your heart's chambers and valves are working. This procedure takes approximately one hour. There are no restrictions for this procedure.    Follow-Up: At [facility], you and your health needs are our priority.  As part of our continuing mission to provide you with exceptional heart care, we have created designated Provider Care Teams.  These Care Teams include your primary Cardiologist (physician) and Advanced Practice Providers (APPs -  Physician Assistants and Nurse Practitioners) who all work together to provide you with the care you need, when you need it.  We recommend signing up for the patient portal called "MyChart".  Sign up information is provided on this After Visit Summary.  MyChart is used to connect with patients for Virtual Visits (Telemedicine).  Patients are able to view lab/test results, encounter notes, upcoming appointments, etc.  Non-urgent messages can be sent to your provider as well.   To  learn more about what you can do with MyChart, go to [email or web address].    Your next appointment:   6 month(s)  The format for your next appointment:   In Person  Provider:   Norman Herrlich, MD    Other Instructions None  Important Information About Sugar

## 2021-12-05 LAB — CBC
Hematocrit: 51.9 % — ABNORMAL HIGH (ref 37.5–51.0)
Hemoglobin: 17.9 g/dL — ABNORMAL HIGH (ref 13.0–17.7)
MCH: 31 pg (ref 26.6–33.0)
MCHC: 34.5 g/dL (ref 31.5–35.7)
MCV: 90 fL (ref 79–97)
Platelets: 210 10*3/uL (ref 150–450)
RBC: 5.77 x10E6/uL (ref 4.14–5.80)
RDW: 13.8 % (ref 11.6–15.4)
WBC: 8.5 10*3/uL (ref 3.4–10.8)

## 2021-12-05 LAB — COMPREHENSIVE METABOLIC PANEL
ALT: 30 IU/L (ref 0–44)
AST: 21 IU/L (ref 0–40)
Albumin/Globulin Ratio: 2 (ref 1.2–2.2)
Albumin: 4.7 g/dL (ref 4.1–5.1)
Alkaline Phosphatase: 94 IU/L (ref 44–121)
BUN/Creatinine Ratio: 12 (ref 9–20)
BUN: 12 mg/dL (ref 6–24)
Bilirubin Total: 0.4 mg/dL (ref 0.0–1.2)
CO2: 21 mmol/L (ref 20–29)
Calcium: 9.4 mg/dL (ref 8.7–10.2)
Chloride: 100 mmol/L (ref 96–106)
Creatinine, Ser: 0.99 mg/dL (ref 0.76–1.27)
Globulin, Total: 2.4 g/dL (ref 1.5–4.5)
Glucose: 100 mg/dL — ABNORMAL HIGH (ref 70–99)
Potassium: 4.2 mmol/L (ref 3.5–5.2)
Sodium: 138 mmol/L (ref 134–144)
Total Protein: 7.1 g/dL (ref 6.0–8.5)
eGFR: 93 mL/min/{1.73_m2} (ref >59)

## 2021-12-05 LAB — LIPID PANEL
Chol/HDL Ratio: 5.1 ratio — ABNORMAL HIGH (ref 0.0–5.0)
Cholesterol, Total: 164 mg/dL (ref 100–199)
HDL: 32 mg/dL — ABNORMAL LOW (ref >39)
LDL Chol Calc (NIH): 87 mg/dL (ref 0–99)
Triglycerides: 268 mg/dL — ABNORMAL HIGH (ref 0–149)
VLDL Cholesterol Cal: 45 mg/dL — ABNORMAL HIGH (ref 5–40)

## 2021-12-05 LAB — LIPOPROTEIN A (LPA): Lipoprotein (a): 197.1 nmol/L — ABNORMAL HIGH (ref <75.0)

## 2021-12-06 ENCOUNTER — Telehealth: Payer: Self-pay | Admitting: Cardiology

## 2021-12-06 ENCOUNTER — Telehealth: Payer: Self-pay

## 2021-12-06 NOTE — Telephone Encounter (Signed)
Patient informed of results.  

## 2021-12-06 NOTE — Telephone Encounter (Signed)
Prior authorization started for Temisartan-HCTZ with [facility]  [name]: [id number] - Rx #: [id number]  Status Sent to [facility] today Drug Telmisartan-HCTZ 40-12.5MG  tablets Form [employer] [place] [employer] Electronic Request Form (CB)

## 2021-12-06 NOTE — Telephone Encounter (Signed)
Patient is returning RN's call regarding his lab results. Please advise.

## 2021-12-07 NOTE — Telephone Encounter (Signed)
RIC AMIS Key: [id number] - Rx #: [id number] Outcome Approved today Effective from 12/06/2021 through 12/05/2022. Drug Telmisartan-HCTZ 40-12.5MG  tablets Form [employer] account executive Form (CB)

## 2021-12-09 ENCOUNTER — Telehealth: Payer: Self-pay | Admitting: *Deleted

## 2021-12-09 NOTE — Telephone Encounter (Signed)
Called pt since he has not been seen in our Anticoagulation Clinic since 04/19/21; he states his PCP has monitoring his INR at home and been sending it to PCP. He states he has been out of strips and will call his company and advised to ensure he calls his PCP to continue the monitoring. Also, the last OV note per Dr. Dulce Sellar does state pt is monitoring through [facility]. Will discontinue Episodes of Care at this time since monitoring is through PCP.

## 2021-12-16 ENCOUNTER — Ambulatory Visit: Payer: BC Managed Care – PPO | Attending: Cardiology

## 2021-12-16 DIAGNOSIS — I10 Essential (primary) hypertension: Secondary | ICD-10-CM | POA: Diagnosis not present

## 2021-12-16 DIAGNOSIS — E782 Mixed hyperlipidemia: Secondary | ICD-10-CM

## 2021-12-16 DIAGNOSIS — Z7901 Long term (current) use of anticoagulants: Secondary | ICD-10-CM

## 2021-12-16 DIAGNOSIS — I513 Intracardiac thrombosis, not elsewhere classified: Secondary | ICD-10-CM | POA: Diagnosis not present

## 2021-12-16 DIAGNOSIS — Z8673 Personal history of transient ischemic attack (TIA), and cerebral infarction without residual deficits: Secondary | ICD-10-CM

## 2021-12-16 LAB — ECHOCARDIOGRAM COMPLETE
Area-P 1/2: 3.91 cm2
S' Lateral: 3.8 cm

## 2021-12-16 MED ORDER — PERFLUTREN LIPID MICROSPHERE
1.0000 mL | INTRAVENOUS | Status: AC | PRN
  Administered 2021-12-16: 10 mL via INTRAVENOUS

## 2021-12-17 ENCOUNTER — Other Ambulatory Visit: Payer: Self-pay | Admitting: Cardiology

## 2021-12-17 NOTE — Telephone Encounter (Signed)
Pt managed through PCP per last note. Unable to refill. Called pharmacy and updated them.

## 2022-01-03 ENCOUNTER — Telehealth: Payer: Self-pay

## 2022-01-03 NOTE — Telephone Encounter (Signed)
-----   Message from Truddie Hidden, RN sent at 12/17/2021  7:42 AM EDT -----  ----- Message ----- From: Richardo Priest, MD Sent: 12/16/2021   5:12 PM EDT To: Rebeca Alert Ash/Hp Triage  I reviewed his echo myself he clearly has an aneurysm in the apex of the ventricle and fortunately with good effective anticoagulation there is no clot present at this time but he needs to remain on warfarin.

## 2022-01-03 NOTE — Telephone Encounter (Signed)
Patient notified of results and recommendations and agreed to continue taking his warfarin

## 2022-03-12 ENCOUNTER — Other Ambulatory Visit: Payer: Self-pay | Admitting: Cardiovascular Disease

## 2022-03-13 DIAGNOSIS — E876 Hypokalemia: Secondary | ICD-10-CM | POA: Insufficient documentation

## 2022-03-13 DIAGNOSIS — D751 Secondary polycythemia: Secondary | ICD-10-CM | POA: Insufficient documentation

## 2022-03-13 DIAGNOSIS — E785 Hyperlipidemia, unspecified: Secondary | ICD-10-CM | POA: Insufficient documentation

## 2022-03-13 DIAGNOSIS — J410 Simple chronic bronchitis: Secondary | ICD-10-CM | POA: Insufficient documentation

## 2022-03-23 DIAGNOSIS — J40 Bronchitis, not specified as acute or chronic: Secondary | ICD-10-CM | POA: Insufficient documentation

## 2022-03-23 DIAGNOSIS — Z7901 Long term (current) use of anticoagulants: Secondary | ICD-10-CM | POA: Insufficient documentation

## 2022-03-23 DIAGNOSIS — Z789 Other specified health status: Secondary | ICD-10-CM | POA: Insufficient documentation

## 2022-04-01 DIAGNOSIS — I69398 Other sequelae of cerebral infarction: Secondary | ICD-10-CM | POA: Insufficient documentation

## 2022-04-21 ENCOUNTER — Ambulatory Visit (INDEPENDENT_AMBULATORY_CARE_PROVIDER_SITE_OTHER): Payer: BC Managed Care – PPO | Admitting: Neurology

## 2022-04-21 ENCOUNTER — Encounter: Payer: Self-pay | Admitting: Neurology

## 2022-04-21 VITALS — BP 108/77 | HR 85 | Ht 69.0 in | Wt 279.0 lb

## 2022-04-21 DIAGNOSIS — I63531 Cerebral infarction due to unspecified occlusion or stenosis of right posterior cerebral artery: Secondary | ICD-10-CM

## 2022-04-21 DIAGNOSIS — H53462 Homonymous bilateral field defects, left side: Secondary | ICD-10-CM

## 2022-04-21 DIAGNOSIS — Z9189 Other specified personal risk factors, not elsewhere classified: Secondary | ICD-10-CM | POA: Diagnosis not present

## 2022-04-21 NOTE — Patient Instructions (Signed)
I had a long d/w patient about his recent right posterior cerebral artery embolic stroke in December 2023 and residual left sided visual field defect,, risk for recurrent stroke/TIAs, personally independently reviewed imaging studies and stroke evaluation results and answered questions.Continue Eliquis 5 mg twice daily for secondary stroke prevention for his left ventricular aneurysm and prior history of clot and maintain strict control of hypertension with blood pressure goal below 130/90, diabetes with hemoglobin A1c goal below 6.5% and lipids with LDL cholesterol goal below 70 mg/dL. I also advised the patient to eat a healthy diet with plenty of whole grains, cereals, fruits and vegetables, exercise regularly and maintain ideal body weight .I advised him not to drive till his peripheral vision loss improves.  He was advised to use a cane when ambulating at all times.  I also recommend referral for polysomnogram for sleep apnea as he appears to be at high risk followup in the future with [place] practitioner in 6 months or call earlier if necessary.  Sleep Apnea Sleep apnea is a condition in which breathing pauses or becomes shallow during sleep. People with sleep apnea usually snore loudly. They may have times when they gasp and stop breathing for 10 seconds or more during sleep. This may happen many times during the night. Sleep apnea disrupts your sleep and keeps your body from getting the rest that it needs. This condition can increase your risk of certain health problems, including: Heart attack. Stroke. Obesity. Type 2 diabetes. Heart failure. Irregular heartbeat. High blood pressure. The goal of treatment is to help you breathe normally again. What are the causes?  The most common cause of sleep apnea is a collapsed or blocked airway. There are three kinds of sleep apnea: Obstructive sleep apnea. This kind is caused by a blocked or collapsed airway. Central sleep apnea. This kind  happens when the part of the brain that controls breathing does not send the correct signals to the muscles that control breathing. Mixed sleep apnea. This is a combination of obstructive and central sleep apnea. What increases the risk? You are more likely to develop this condition if you: Are overweight. Smoke. Have a smaller than normal airway. Are older. Are male. Drink alcohol. Take sedatives or tranquilizers. Have a family history of sleep apnea. Have a tongue or tonsils that are larger than normal. What are the signs or symptoms? Symptoms of this condition include: Trouble staying asleep. Loud snoring. Morning headaches. Waking up gasping. Dry mouth or sore throat in the morning. Daytime sleepiness and tiredness. If you have daytime fatigue because of sleep apnea, you may be more likely to have: Trouble concentrating. Forgetfulness. Irritability or mood swings. Personality changes. Feelings of depression. Sexual dysfunction. This may include loss of interest if you are male, or erectile dysfunction if you are male. How is this diagnosed? This condition may be diagnosed with: A medical history. A physical exam. A series of tests that are done while you are sleeping (sleep study). These tests are usually done in a sleep lab, but they may also be done at home. How is this treated? Treatment for this condition aims to restore normal breathing and to ease symptoms during sleep. It may involve managing health issues that can affect breathing, such as high blood pressure or obesity. Treatment may include: Sleeping on your side. Using a decongestant if you have nasal congestion. Avoiding the use of depressants, including alcohol, sedatives, and narcotics. Losing weight if you are overweight. Making changes to your  diet. Quitting smoking. Using a device to open your airway while you sleep, such as: An oral appliance. This is a custom-made mouthpiece that shifts your lower  jaw forward. A continuous positive airway pressure (CPAP) device. This device blows air through a mask when you breathe out (exhale). A nasal expiratory positive airway pressure (EPAP) device. This device has valves that you put into each nostril. A bi-level positive airway pressure (BIPAP) device. This device blows air through a mask when you breathe in (inhale) and breathe out (exhale). Having surgery if other treatments do not work. During surgery, excess tissue is removed to create a wider airway. Follow these instructions at home: Lifestyle Make any lifestyle changes that your health care provider recommends. Eat a healthy, well-balanced diet. Take steps to lose weight if you are overweight. Avoid using depressants, including alcohol, sedatives, and narcotics. Do not use any products that contain nicotine or tobacco. These products include cigarettes, chewing tobacco, and vaping devices, such as e-cigarettes. If you need help quitting, ask your health care provider. General instructions Take over-the-counter and prescription medicines only as told by your health care provider. If you were given a device to open your airway while you sleep, use it only as told by your health care provider. If you are having surgery, make sure to tell your health care provider you have sleep apnea. You may need to bring your device with you. Keep all follow-up visits. This is important. Contact a health care provider if: The device that you received to open your airway during sleep is uncomfortable or does not seem to be working. Your symptoms do not improve. Your symptoms get worse. Get help right away if: You develop: Chest pain. Shortness of breath. Discomfort in your back, arms, or stomach. You have: Trouble speaking. Weakness on one side of your body. Drooping in your face. These symptoms may represent a serious problem that is an emergency. Do not wait to see if the symptoms will go away. Get  medical help right away. Call your local emergency services (911 in the [place] Do not drive yourself to the hospital. Summary Sleep apnea is a condition in which breathing pauses or becomes shallow during sleep. The most common cause is a collapsed or blocked airway. The goal of treatment is to restore normal breathing and to ease symptoms during sleep. This information is not intended to replace advice given to you by your health care provider. Make sure you discuss any questions you have with your health care provider. Document Revised: 10/21/2020 Document Reviewed: 02/21/2020 Elsevier Patient Education  [place].
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
## 2022-05-10 ENCOUNTER — Ambulatory Visit: Payer: BC Managed Care – PPO | Attending: Cardiology | Admitting: Cardiology

## 2022-05-12 ENCOUNTER — Encounter: Payer: Self-pay | Admitting: Cardiology

## 2022-05-12 ENCOUNTER — Ambulatory Visit: Payer: BC Managed Care – PPO | Attending: Cardiology | Admitting: Cardiology

## 2022-05-12 VITALS — BP 124/78 | HR 87 | Ht 69.0 in | Wt 280.2 lb

## 2022-05-12 DIAGNOSIS — I10 Essential (primary) hypertension: Secondary | ICD-10-CM

## 2022-05-12 DIAGNOSIS — I513 Intracardiac thrombosis, not elsewhere classified: Secondary | ICD-10-CM | POA: Diagnosis not present

## 2022-05-12 DIAGNOSIS — I25118 Atherosclerotic heart disease of native coronary artery with other forms of angina pectoris: Secondary | ICD-10-CM

## 2022-05-12 DIAGNOSIS — E782 Mixed hyperlipidemia: Secondary | ICD-10-CM

## 2022-05-12 DIAGNOSIS — Z8673 Personal history of transient ischemic attack (TIA), and cerebral infarction without residual deficits: Secondary | ICD-10-CM | POA: Diagnosis not present

## 2022-05-12 DIAGNOSIS — Z7901 Long term (current) use of anticoagulants: Secondary | ICD-10-CM

## 2022-05-12 NOTE — Patient Instructions (Signed)
Medication Instructions:  Your physician recommends that you continue on your current medications as directed. Please refer to the Current Medication list given to you today.  *If you need a refill on your cardiac medications before your next appointment, please call your pharmacy*   Lab Work: None If you have labs (blood work) drawn today and your tests are completely normal, you will receive your results only by: [place] (if you have MyChart) OR A paper copy in the mail If you have any lab test that is abnormal or we need to change your treatment, we will call you to review the results.   Testing/Procedures: None   Follow-Up: At [facility], you and your health needs are our priority.  As part of our continuing mission to provide you with exceptional heart care, we have created designated Provider Care Teams.  These Care Teams include your primary Cardiologist (physician) and Advanced Practice Providers (APPs -  Physician Assistants and Nurse Practitioners) who all work together to provide you with the care you need, when you need it.  We recommend signing up for the patient portal called "MyChart".  Sign up information is provided on this After Visit Summary.  MyChart is used to connect with patients for Virtual Visits (Telemedicine).  Patients are able to view lab/test results, encounter notes, upcoming appointments, etc.  Non-urgent messages can be sent to your provider as well.   To learn more about what you can do with MyChart, go to [email or web address].    Your next appointment:   6 month(s)  Provider:   Shirlee More, MD    Other Instructions None  This visit was accompanied by Glade Lloyd
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
## 2022-05-26 ENCOUNTER — Encounter: Payer: Self-pay | Admitting: Cardiology

## 2022-05-30 DIAGNOSIS — Z0289 Encounter for other administrative examinations: Secondary | ICD-10-CM

## 2022-06-03 ENCOUNTER — Ambulatory Visit: Payer: BC Managed Care – PPO | Admitting: Cardiology

## 2022-06-14 ENCOUNTER — Telehealth: Payer: Self-pay

## 2022-06-14 NOTE — Telephone Encounter (Signed)
Received disability paperwork from patient wanting to be completed, I do not see in notes where he is to be out of work. Do you want to complete forms?

## 2022-06-20 ENCOUNTER — Encounter: Payer: Self-pay | Admitting: Neurology

## 2022-06-30 ENCOUNTER — Telehealth: Payer: Self-pay | Admitting: *Deleted

## 2022-06-30 NOTE — Telephone Encounter (Signed)
I faxed pt FMLA form on 06/30/2022 to [phone]

## 2022-07-29 DIAGNOSIS — M25512 Pain in left shoulder: Secondary | ICD-10-CM | POA: Insufficient documentation

## 2022-07-29 DIAGNOSIS — R7303 Prediabetes: Secondary | ICD-10-CM | POA: Insufficient documentation

## 2022-07-29 DIAGNOSIS — Z8673 Personal history of transient ischemic attack (TIA), and cerebral infarction without residual deficits: Secondary | ICD-10-CM | POA: Insufficient documentation

## 2022-10-13 ENCOUNTER — Encounter: Payer: Self-pay | Admitting: Adult Health

## 2022-10-13 ENCOUNTER — Ambulatory Visit (INDEPENDENT_AMBULATORY_CARE_PROVIDER_SITE_OTHER): Payer: Self-pay | Admitting: Adult Health

## 2022-10-13 VITALS — BP 103/71 | HR 88 | Ht 69.0 in | Wt 258.0 lb

## 2022-10-13 DIAGNOSIS — M792 Neuralgia and neuritis, unspecified: Secondary | ICD-10-CM

## 2022-10-13 DIAGNOSIS — I63531 Cerebral infarction due to unspecified occlusion or stenosis of right posterior cerebral artery: Secondary | ICD-10-CM

## 2022-10-13 MED ORDER — GABAPENTIN 300 MG PO CAPS: 300.0000 mg | ORAL_CAPSULE | Freq: Three times a day (TID) | ORAL | 5 refills | Status: DC

## 2022-10-13 NOTE — Patient Instructions (Signed)
Increase gabapentin to 300mg  three times daily - if no benefit, please let me know  Continue to stay as active as you can as long as safety permits  Would recommend looking into a foot drop brace - you can find these on amaz[employer]   Continue Eliquis (apixaban) daily  and atorvastatin  for secondary stroke prevention  Continue to follow up with PCP regarding blood pressure and diabetes management  Maintain strict control of hypertension with blood pressure goal below 130/90 and cholesterol with LDL cholesterol (bad cholesterol) goal below 70 mg/dL.   Signs of a Stroke? Follow the BEFAST method:  Balance Watch for a sudden loss of balance, trouble with coordination or vertigo Eyes Is there a sudden loss of vision in one or both eyes? Or double vision?  Face: Ask the person to smile. Does one side of the face droop or is it numb?  Arms: Ask the person to raise both arms. Does one arm drift downward? Is there weakness or numbness of a leg? Speech: Ask the person to repeat a simple phrase. Does the speech sound slurred/strange? Is the person confused ? Time: If you observe any of these signs, call 911.      Followup in the future with me in 6 months or call earlier if needed      Thank you for coming to see [place] at [facility]. I hope we have been able to provide you high quality care today.  You may receive a patient satisfaction survey over the next few weeks. We would appreciate your feedback and comments so that we may continue to improve ourselves and the health of our patients.
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
## 2022-11-17 ENCOUNTER — Institutional Professional Consult (permissible substitution): Payer: Self-pay | Admitting: Neurology

## 2022-12-27 ENCOUNTER — Encounter: Payer: Self-pay | Admitting: Adult Health

## 2023-01-10 ENCOUNTER — Other Ambulatory Visit: Payer: Self-pay | Admitting: Neurology

## 2023-01-10 MED ORDER — APIXABAN 5 MG PO TABS: 5.0000 mg | ORAL_TABLET | Freq: Two times a day (BID) | ORAL | 2 refills | Status: DC

## 2023-04-14 ENCOUNTER — Telehealth: Payer: Self-pay | Admitting: Cardiology

## 2023-04-14 NOTE — Telephone Encounter (Signed)
Called patient and he stated that he would drop of his patient assistance forms for Eliquis to the office on Monday. Patient had no further questions at this time.

## 2023-04-14 NOTE — Telephone Encounter (Signed)
Pt c/o medication issue:  1. Name of Medication:   apixaban (ELIQUIS) 5 MG TABS tablet   2. How are you currently taking this medication (dosage and times per day)?   3. Are you having a reaction (difficulty breathing--STAT)?   4. What is your medication issue?   Patient stated he will be sending medication assistance paperwork through MyChart for Dr. Dulce Sellar to complete and forward to [facility].

## 2023-04-14 NOTE — Telephone Encounter (Signed)
Spoke to patient he stated he has completed his portion of patient assistance for Eliquis.He will take to Dr.Munley's office next week along with his proof of income.He will need Dr.Munley to complete physician portion and then fax to [facility] patient assistance.I will send message to Dr.Munley's RN.

## 2023-04-18 ENCOUNTER — Telehealth: Payer: Self-pay | Admitting: Adult Health

## 2023-04-18 NOTE — Telephone Encounter (Signed)
 MYC confirmation

## 2023-04-19 ENCOUNTER — Encounter: Payer: Self-pay | Admitting: Adult Health

## 2023-04-19 ENCOUNTER — Ambulatory Visit: Payer: Self-pay | Admitting: Adult Health
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
## 2023-06-06 ENCOUNTER — Ambulatory Visit: Payer: BC Managed Care – PPO | Attending: Cardiology | Admitting: Cardiology

## 2023-06-06 DIAGNOSIS — I25118 Atherosclerotic heart disease of native coronary artery with other forms of angina pectoris: Secondary | ICD-10-CM

## 2023-06-06 DIAGNOSIS — I10 Essential (primary) hypertension: Secondary | ICD-10-CM

## 2023-06-06 DIAGNOSIS — Z8673 Personal history of transient ischemic attack (TIA), and cerebral infarction without residual deficits: Secondary | ICD-10-CM

## 2023-06-06 DIAGNOSIS — I513 Intracardiac thrombosis, not elsewhere classified: Secondary | ICD-10-CM

## 2023-06-06 DIAGNOSIS — E782 Mixed hyperlipidemia: Secondary | ICD-10-CM

## 2023-06-06 DIAGNOSIS — Z7901 Long term (current) use of anticoagulants: Secondary | ICD-10-CM

## 2023-09-22 ENCOUNTER — Encounter (HOSPITAL_COMMUNITY): Payer: Self-pay | Admitting: Interventional Radiology

## 2023-11-09 NOTE — Progress Notes (Deleted)
  Cardiology Office Note   Date:  11/09/2023  ID:  [name], DOB 06/16/1970, MRN [id number] PCP: Hunter Mickey Browner, PA-C  [facility] Providers Cardiologist:  Redell Leiter, MD { Click to update primary MD,subspecialty MD or APP then REFRESH:1}    History of Present Illness [name] is a 53 y.o. male with past medical history of hypertension, history of stroke,  intracardiac thrombus, dyslipidemia, tobacco use  12/16/2021 echo EF 55-60, moderate LVH, grade 1 DD, LA moderately dilated and Rezum in the apex of his ventricle with no clot present 02/20/2020 coronary CTA calcium score of 243, 99th percentile, left ventricular apex is aneurysmal with thrombus sitting in the apex of the LV, FFR was negative for hemodynamic significance 12/29/2019 echo semimobile defect consistent with apical thrombus, EF 55-60  [facility] established with [facility] in 2021 while he was hospitalized, he had likely had an unrecognizable myocardial infarction resulting in left ventricular thrombus.  He previously had a CT scan revealing a thrombus of the splenic artery and SMA, then CT of the chest revealed ventricular thrombus and he was anticoagulated following this he sustained strokes with multiple acute infarctions concerning for embolic etiology and noncompliance with his anticoagulant was felt to be the contributory cause.  Most recently was evaluated by Dr. Leiter on 05/12/2022, he had unfortunately had a repeat stroke (evaluated at [facility] in [place]), his INR was subtherapeutic at 1.1.  ROS: ***  Studies Reviewed      *** Risk Assessment/Calculations {Does this patient have ATRIAL FIBRILLATION?[phone]} No BP recorded.  {Refresh Note OR Click here to enter BP  :1}***       Physical Exam VS:  There were no vitals taken for this visit.       Wt Readings from Last 3 Encounters:  10/13/22 258 lb (117 kg)  05/12/22 280 lb 3.2 oz (127.1 kg)  04/21/22 279 lb (126.6 kg)    GEN: Well  nourished, well developed in no acute distress NECK: No JVD; No carotid bruits CARDIAC: ***RRR, no murmurs, rubs, gallops RESPIRATORY:  Clear to auscultation without rales, wheezing or rhonchi  ABDOMEN: Soft, non-tender, non-distended EXTREMITIES:  No edema; No deformity   ASSESSMENT AND PLAN ***    {Are you ordering a CV Procedure (e.g. stress test, cath, DCCV, TEE, etc)?   Press F2        :[id number]}  Dispo: ***  Signed, Delon JAYSON Hoover, NP

## 2023-11-10 ENCOUNTER — Ambulatory Visit: Admitting: Cardiology

## 2023-11-21 NOTE — Progress Notes (Signed)
 " Cardiology Office Note   Date:  11/22/2023  ID:  [name], DOB 07-Feb-1971, MRN [id number] PCP: Hunter Mickey Browner, PA-C  [facility] Providers Cardiologist:  Redell Leiter, MD Cardiology APP:  Carlin Jon BROCKS, NP     History of Present Illness [name] is a 53 y.o. male with past medical history of hypertension, history of stroke, intracardiac thrombus, dyslipidemia, tobacco use.   12/16/2021 echo EF 55-60, moderate LVH, grade 1 DD, LA moderately dilated and Rezum in the apex of his ventricle with no clot present 02/20/2020 coronary CTA calcium  score of 243, 99th percentile, left ventricular apex is aneurysmal with thrombus sitting in the apex of the LV, FFR was negative for hemodynamic significance 12/29/2019 echo semimobile defect consistent with apical thrombus, EF 55-60  [facility] with [facility] in 2021 while he was hospitalized, he had likely had an unrecognizable myocardial infarction resulting in left ventricular thrombus.  He previously had a CT scan revealing a thrombus of the splenic artery and SMA, then CT of the chest revealed ventricular thrombus and he was anticoagulated following this he sustained strokes with multiple acute infarctions concerning for embolic etiology and noncompliance with his anticoagulant was felt to be the contributory cause.  Most recently was evaluated by Dr. Leiter on 05/12/2022, he had unfortunately had a repeat stroke (evaluated at [facility] in [place]), his INR was subtherapeutic at 1.1.  He presents today for follow-up.  He has been doing relatively stable from a cardiac perspective since he was last evaluated in our office.  He is now disabled following his stroke and is working to get approved for disability.  He stays relatively active around his home.  His blood pressure is elevated in the office today but he has been out of his blood pressure medicine for approximately a week. He denies chest pain, palpitations, dyspnea,  pnd, orthopnea, n, v, dizziness, syncope, edema, weight gain, or early satiety.   ROS: Review of Systems  Neurological:  Positive for focal weakness.  All other systems reviewed and are negative.    Studies Reviewed EKG Interpretation Date/Time:  Wednesday November 22 2023 08:24:30 EDT Ventricular Rate:  77 PR Interval:  174 QRS Duration:  94 QT Interval:  366 QTC Calculation: 414 R Axis:   70  Text Interpretation: Normal sinus rhythm Possible Inferior infarct , age undetermined Anterolateral infarct , age undetermined When compared with ECG of 28-Dec-2019 12:04, PREVIOUS ECG IS PRESENT Confirmed by Carlin Jon ([id number]) on 11/22/2023 8:39:59 AM    Cardiac Studies & Procedures   ______________________________________________________________________________________________     ECHOCARDIOGRAM  ECHOCARDIOGRAM COMPLETE 12/16/2021  Narrative ECHOCARDIOGRAM REPORT    Patient Name:   [name] Date of Exam: 12/16/2021 Medical Rec #:  [id number]    Height:       70.0 in Accession #:    [id number]   Weight:       295.9 lb Date of Birth:  1970/09/29    BSA:          2.465 m Patient Age:    51 years     BP:           178/118 mmHg Patient Gender: M            HR:           79 bpm. Exam Location:  [place]  Procedure: 2D Echo, Cardiac Doppler, Color Doppler and Intracardiac Opacification Agent  Indications:    LV (left ventricular) mural thrombus [I51.3 (  ICD-10-CM)]; History of CVA (cerebrovascular accident) [Z86.73 (ICD-10-CM)]; Primary hypertension [I10 (ICD-10-CM)]; Mixed hyperlipidemia [E78.2 (ICD-10-CM)]; Chronic anticoagulation Z79.01 (ICD-10-CM)  History:        Patient has prior history of Echocardiogram examinations, most recent 12/29/2019. Risk Factors:Hypertension and Dyslipidemia. LV (left ventricular) mural thrombus.  Sonographer:    Charlie Jointer RDCS Referring Phys: [id number] [facility]   Sonographer Comments: Technically difficult study due to poor echo  windows and patient is obese. IMPRESSIONS   1. Akinetic apical cap. Contrast used. No convicing evidence of negative contrast shadow. Suggests resolution of the thrombus. Layering noted, suggesting that the thrombus has possibly organized with time. Clinical correlation suggested. Left ventricular ejection fraction, by estimation, is 55 to 60%. The left ventricle has normal function. The left ventricle has no regional wall motion abnormalities. There is moderate left ventricular hypertrophy. Left ventricular diastolic parameters are consistent with Grade I diastolic dysfunction (impaired relaxation). 2. Right ventricular systolic function is normal. The right ventricular size is normal. 3. Left atrial size was moderately dilated. 4. The mitral valve is normal in structure. No evidence of mitral valve regurgitation. No evidence of mitral stenosis. 5. The aortic valve is normal in structure. Aortic valve regurgitation is not visualized. No aortic stenosis is present. 6. The inferior vena cava is normal in size with greater than 50% respiratory variability, suggesting right atrial pressure of 3 mmHg.  FINDINGS Left Ventricle: Akinetic apical cap. Contrast used. No convicing evidence of negative contrast shadow. Suggests resolution of the thrombus. Layering noted, suggesting that the thrombus has possibly organized with time. Clinical correlation suggested. Left ventricular ejection fraction, by estimation, is 55 to 60%. The left ventricle has normal function. The left ventricle has no regional wall motion abnormalities. Definity  contrast agent was given IV to delineate the left ventricular endocardial borders. The left ventricular internal cavity size was normal in size. There is moderate left ventricular hypertrophy. Left ventricular diastolic parameters are consistent with Grade I diastolic dysfunction (impaired relaxation).  Right Ventricle: The right ventricular size is normal. No increase in  right ventricular wall thickness. Right ventricular systolic function is normal.  Left Atrium: Left atrial size was moderately dilated.  Right Atrium: Right atrial size was normal in size.  Pericardium: There is no evidence of pericardial effusion.  Mitral Valve: The mitral valve is normal in structure. No evidence of mitral valve regurgitation. No evidence of mitral valve stenosis.  Tricuspid Valve: The tricuspid valve is normal in structure. Tricuspid valve regurgitation is not demonstrated. No evidence of tricuspid stenosis.  Aortic Valve: The aortic valve is normal in structure. Aortic valve regurgitation is not visualized. No aortic stenosis is present.  Pulmonic Valve: The pulmonic valve was normal in structure. Pulmonic valve regurgitation is not visualized. No evidence of pulmonic stenosis.  Aorta: The aortic root is normal in size and structure.  Venous: The inferior vena cava is normal in size with greater than 50% respiratory variability, suggesting right atrial pressure of 3 mmHg.  IAS/Shunts: No atrial level shunt detected by color flow Doppler.   LEFT VENTRICLE PLAX 2D LVIDd:         5.30 cm   Diastology LVIDs:         3.80 cm   LV e' medial:    6.09 cm/s LV PW:         1.40 cm   LV E/e' medial:  10.9 LV IVS:        1.90 cm   LV e' lateral:   5.00  cm/s LVOT diam:     2.40 cm   LV E/e' lateral: 13.2 LV SV:         97 LV SV Index:   39 LVOT Area:     4.52 cm   RIGHT VENTRICLE RV S prime:     12.20 cm/s TAPSE (M-mode): 2.0 cm  LEFT ATRIUM             Index LA diam:        4.70 cm 1.91 cm/m LA Vol (A2C):   40.9 ml 16.59 ml/m LA Vol (A4C):   47.6 ml 19.31 ml/m LA Biplane Vol: 44.1 ml 17.89 ml/m AORTIC VALVE LVOT Vmax:   112.00 cm/s LVOT Vmean:  66.600 cm/s LVOT VTI:    0.214 m  AORTA Ao Root diam: 4.00 cm Ao Asc diam:  3.70 cm Ao Desc diam: 2.80 cm  MITRAL VALVE MV Area (PHT): 3.91 cm     SHUNTS MV Decel Time: 194 msec     Systemic VTI:  0.21  m MV E velocity: 66.10 cm/s   Systemic Diam: 2.40 cm MV A velocity: 111.00 cm/s MV E/A ratio:  0.60  Rayvion Stumph Crape MD Electronically signed by Madisan Bice Crape MD Signature Date/Time: 12/16/2021/4:37:50 PM    Final      CT SCANS  CT CORONARY FRACTIONAL FLOW RESERVE DATA PREP 02/20/2020  Narrative EXAM: FFRCT ANALYSIS  FINDINGS: FFRct analysis was performed on the original cardiac CT angiogram dataset. Diagrammatic representation of the FFRct analysis is provided in a separate PDF document in PACS. This dictation was created using the PDF document and an interactive 3D model of the results. 3D model is not available in the EMR/PACS. Normal FFR range is >0.80.  1. Left Main: 0.97  2. LAD: Proximal 0.96, Mid 0.92, Distal 0.86  3. LCX: Proximal 0.96, Mid 0.96, Distal 0.91  4. Ramus: Proximal 0.98, Mid 0.90  5. RCA: Proximal 0.97, Mid 0.92, Distal 0.95  IMPRESSION: The FFRct does not show any flow limiting stenosis. Recommend aggressive medical therapy with antianginals.  Note: These examples are not recommendations of [employer] and only provided as examples of what other customers are doing.   Electronically Signed By: Kardie  Tobb DO On: 02/24/2020 08:52   CT SCANS  CT CORONARY MORPH W/CTA COR W/SCORE 02/19/2020  Addendum 02/19/2020 11:23 PM ADDENDUM REPORT: 02/19/2020 23:21  CLINICAL DATA:  This is a 53 year old male with hypertension, history of stroke and hypercholesterolemia.  EXAM: Cardiac/Coronary  CT  TECHNIQUE: The patient was scanned on a [employer].  FINDINGS: A 120 kV prospective scan was triggered in the descending thoracic aorta at 111 HU's. Axial non-contrast 3 mm slices were carried out through the heart. The data set was analyzed on a dedicated work station and scored using the Agatson method. Gantry rotation speed was 250 msecs and collimation was .6 mm. No beta blockade and 0.8 mg of sl NTG was given. The 3D data set  was reconstructed in 5% intervals of the 67-82 % of the R-R cycle. Diastolic phases were analyzed on a dedicated work station using MPR, MIP and VRT modes. The patient received 80 cc of contrast.  Aorta: Normal size.  No calcifications.  No dissection.  Aortic Valve:  Trileaflet.  No calcifications.  Coronary Arteries:  Normal coronary origin.  Right dominance.  RCA is a large dominant artery that gives rise to PDA and PLVB. There is minimal soft plaque in the proximal RCA. The is minimal soft plaque in  the distal RCA. The ostial PDA with a mild 25-49%).  Left main is a large artery that gives rise to LAD, ramus Intermedius and LCX arteries.  LAD is a large vessel. There is mild (25-49%) calcified plaque in the proxima LAD. Mid LAD with mild (25-49%) mixed lesion. The mid to distal LAD with moderate (50-69%) soft plaque, this portion of the LAD with a smaller lumen and the lesion is at a mild bend therefore will send this study FFR to determine if flow is limited.  Ramus intermedius with no plaque.  LCX is a non-dominant artery that gives rise to one large OM1 branch. There is minimal (<24%) proximal diffuse calcified plaques.  Other findings:  The left ventricular apex is aneurysmal. There is 21 mm x 9.1 mm thrombus sitting in the apex of the left ventricle.  Normal pulmonary vein drainage into the left atrium.  Normal left atrial appendage without a thrombus.  Normal size of the pulmonary artery.  IMPRESSION: 1. Moderate CAD.  CADRADS 3.  This study will be send for FFRct.  2. Coronary calcium  score of 243. This was 54 percentile for age and sex matched control.  3. Normal coronary origin with right dominance.  4. The left ventricular apex is aneurysmal. There is 21 mm x 9.1 mm thrombus sitting in the apex of the left ventricle.  Kardie Tobb, DO   Electronically Signed By: Kardie  Tobb DO On: 02/19/2020 23:21  Narrative EXAM: OVER-READ INTERPRETATION  CT  CHEST  The following report is an over-read performed by radiologist Dr. Franky Crease of [facility] on 02/19/2020. This over-read does not include interpretation of cardiac or coronary anatomy or pathology. The coronary CTA interpretation by the cardiologist is attached.  COMPARISON:  None.  FINDINGS: Vascular: Heart is normal size.  Aorta normal caliber.  Mediastinum/Nodes: No adenopathy.  Lungs/Pleura: Emphysematous changes in the lungs. No confluent opacities or effusions.  Upper Abdomen: Imaging into the upper abdomen demonstrates no acute findings.  Musculoskeletal: Chest wall soft tissues are unremarkable. No acute bony abnormality.  IMPRESSION: Emphysematous changes.  No active disease.  Electronically Signed: By: Franky Crease M.D. On: 02/19/2020 15:25     ______________________________________________________________________________________________      Risk Assessment/Calculations   HYPERTENSION CONTROL Vitals:   11/22/23 0816 11/22/23 0954  BP: (!) 156/116 (!) 150/90    The patient's blood pressure is elevated above target today.  In order to address the patient's elevated BP: Labs and/or other diagnostics are currently pending prior to making blood pressure medication adjustments. (out of HTN meds x 1 week)          Physical Exam VS:  BP (!) 150/90   Pulse 81   Ht 5' 9 (1.753 m)   Wt 245 lb 9.6 oz (111.4 kg)   SpO2 98%   BMI 36.27 kg/m        Wt Readings from Last 3 Encounters:  11/22/23 245 lb 9.6 oz (111.4 kg)  10/13/22 258 lb (117 kg)  05/12/22 280 lb 3.2 oz (127.1 kg)    GEN: Well nourished, well developed in no acute distress NECK: No JVD; No carotid bruits CARDIAC: RRR, no murmurs, rubs, gallops RESPIRATORY:  Clear to auscultation without rales, wheezing or rhonchi  ABDOMEN: Soft, non-tender, non-distended EXTREMITIES:  No edema; No deformity   ASSESSMENT AND PLAN History of CVA - s/p intracranial stent, follows  with Dr. Rosemarie, residual left sided deficits. On Eliquis  5 mg twice daily, will continue, tolerating without any  adverse bleeding effects. Repeat CBC, CMET.  Intracardiac thrombus-recommended to continue anticoagulation indefinitely, previously had been on warfarin. Eliquis  per above.   CAD -calcium  score 243, 90th percentile, Stable with no anginal symptoms. No indication for ischemic evaluation.  Heart healthy diet and regular cardiovascular exercise encouraged.    Hypertension - BP is elevated today but he has been out of his Micardis  for at least a week.  Will repeat c-Met and then plan to restart this.  Will have him keep a blood pressure log for 2 weeks to see if we need to add any additional medications as he mentions he has some type of calcium  channel blocker at home that he been previously prescribed for his hypertension as well but is also not currently taking.  Dyslipidemia/evaded LP(a)-will repeat fasting lipid panel, LFTs, continue Lipitor if his lipids are not at goal we will plan to increase this.       Dispo: Fasting lipid panel, CMET, CBC, blood pressure log for 2 weeks after restarting Micardis , follow-up in 6 months.  Signed, Jon JAYSON Hoover, NP  "

## 2023-11-22 ENCOUNTER — Other Ambulatory Visit (HOSPITAL_COMMUNITY): Payer: Self-pay

## 2023-11-22 ENCOUNTER — Ambulatory Visit: Attending: Cardiology | Admitting: Cardiology

## 2023-11-22 ENCOUNTER — Encounter: Payer: Self-pay | Admitting: Cardiology

## 2023-11-22 ENCOUNTER — Telehealth: Payer: Self-pay | Admitting: Pharmacy Technician

## 2023-11-22 VITALS — BP 150/90 | HR 81 | Ht 69.0 in | Wt 245.6 lb

## 2023-11-22 DIAGNOSIS — Z8673 Personal history of transient ischemic attack (TIA), and cerebral infarction without residual deficits: Secondary | ICD-10-CM

## 2023-11-22 DIAGNOSIS — I513 Intracardiac thrombosis, not elsewhere classified: Secondary | ICD-10-CM

## 2023-11-22 DIAGNOSIS — I25118 Atherosclerotic heart disease of native coronary artery with other forms of angina pectoris: Secondary | ICD-10-CM

## 2023-11-22 DIAGNOSIS — Z7901 Long term (current) use of anticoagulants: Secondary | ICD-10-CM | POA: Diagnosis not present

## 2023-11-22 DIAGNOSIS — E782 Mixed hyperlipidemia: Secondary | ICD-10-CM | POA: Diagnosis not present

## 2023-11-22 DIAGNOSIS — I219 Acute myocardial infarction, unspecified: Secondary | ICD-10-CM

## 2023-11-22 MED ORDER — ATORVASTATIN CALCIUM 40 MG PO TABS: 80.0000 mg | ORAL_TABLET | Freq: Every day | ORAL | 3 refills | Status: AC

## 2023-11-22 MED ORDER — APIXABAN 5 MG PO TABS: 5.0000 mg | ORAL_TABLET | Freq: Two times a day (BID) | ORAL | 2 refills | Status: DC

## 2023-11-22 MED ORDER — TELMISARTAN-HCTZ 40-12.5 MG PO TABS: 1.0000 | ORAL_TABLET | Freq: Every day | ORAL | 3 refills | Status: AC

## 2023-11-22 NOTE — Telephone Encounter (Signed)
   Pharmacy Patient Advocate Encounter   Received notification from Onbase that prior authorization for telmisartan benson is required/requested.   Insurance verification completed.   The patient is insured through [employer] .   Per test claim: PA required; PA submitted to above mentioned insurance via Latent Key/confirmation #/EOC [id number] Status is pending

## 2023-11-22 NOTE — Patient Instructions (Signed)
 Medication Instructions:  Your physician recommends that you continue on your current medications as directed. Please refer to the Current Medication list given to you today.  *If you need a refill on your cardiac medications before your next appointment, please call your pharmacy*   Lab Work: CBC, CMP, Lipid- today If you have labs (blood work) drawn today and your tests are completely normal, you will receive your results only by: MyChart Message (if you have MyChart) OR A paper copy in the mail If you have any lab test that is abnormal or we need to change your treatment, we will call you to review the results.   Testing/Procedures: None Ordered   Follow-Up: At [facility], you and your health needs are our priority.  As part of our continuing mission to provide you with exceptional heart care, we have created designated Provider Care Teams.  These Care Teams include your primary Cardiologist (physician) and Advanced Practice Providers (APPs -  Physician Assistants and Nurse Practitioners) who all work together to provide you with the care you need, when you need it.  We recommend signing up for the patient portal called MyChart.  Sign up information is provided on this After Visit Summary.  MyChart is used to connect with patients for Virtual Visits (Telemedicine).  Patients are able to view lab/test results, encounter notes, upcoming appointments, etc.  Non-urgent messages can be sent to your provider as well.   To learn more about what you can do with MyChart, go to [email or web address].    Your next appointment:   6 month(s)  The format for your next appointment:   In Person  Provider:   Lamar Fitch, MD    Other Instructions NA

## 2023-11-22 NOTE — Telephone Encounter (Signed)
 Pharmacy Patient Advocate Encounter  Received notification from [employer] that Prior Authorization for telmisartan benson has been APPROVED from 11/22/23 to 11/21/24   PA #/Case ID/Reference #: [id number]

## 2023-11-23 ENCOUNTER — Ambulatory Visit: Payer: Self-pay | Admitting: Cardiology

## 2023-11-23 LAB — LIPID PANEL
Chol/HDL Ratio: 5.9 ratio — ABNORMAL HIGH (ref 0.0–5.0)
Cholesterol, Total: 194 mg/dL (ref 100–199)
HDL: 33 mg/dL — ABNORMAL LOW
LDL Chol Calc (NIH): 138 mg/dL — ABNORMAL HIGH (ref 0–99)
Triglycerides: 124 mg/dL (ref 0–149)
VLDL Cholesterol Cal: 23 mg/dL (ref 5–40)

## 2023-11-23 LAB — CBC
Hematocrit: 55.2 % — ABNORMAL HIGH (ref 37.5–51.0)
Hemoglobin: 17.9 g/dL — ABNORMAL HIGH (ref 13.0–17.7)
MCH: 29.2 pg (ref 26.6–33.0)
MCHC: 32.4 g/dL (ref 31.5–35.7)
MCV: 90 fL (ref 79–97)
Platelets: 202 x10E3/uL (ref 150–450)
RBC: 6.12 x10E6/uL — ABNORMAL HIGH (ref 4.14–5.80)
RDW: 14.3 % (ref 11.6–15.4)
WBC: 7 x10E3/uL (ref 3.4–10.8)

## 2023-11-23 LAB — COMPREHENSIVE METABOLIC PANEL WITH GFR
ALT: 16 IU/L (ref 0–44)
AST: 15 IU/L (ref 0–40)
Albumin: 4.7 g/dL (ref 3.8–4.9)
Alkaline Phosphatase: 86 IU/L (ref 44–121)
BUN/Creatinine Ratio: 12 (ref 9–20)
BUN: 11 mg/dL (ref 6–24)
Bilirubin Total: 0.8 mg/dL (ref 0.0–1.2)
CO2: 21 mmol/L (ref 20–29)
Calcium: 9.8 mg/dL (ref 8.7–10.2)
Chloride: 101 mmol/L (ref 96–106)
Creatinine, Ser: 0.95 mg/dL (ref 0.76–1.27)
Globulin, Total: 2.2 g/dL (ref 1.5–4.5)
Glucose: 92 mg/dL (ref 70–99)
Potassium: 4.8 mmol/L (ref 3.5–5.2)
Sodium: 138 mmol/L (ref 134–144)
Total Protein: 6.9 g/dL (ref 6.0–8.5)
eGFR: 96 mL/min/1.73

## 2023-12-01 ENCOUNTER — Telehealth: Payer: Self-pay | Admitting: Cardiology

## 2023-12-01 MED ORDER — APIXABAN 5 MG PO TABS: 5.0000 mg | ORAL_TABLET | Freq: Two times a day (BID) | ORAL | 1 refills | Status: DC

## 2023-12-01 NOTE — Telephone Encounter (Signed)
*  STAT* If patient is at the pharmacy, call can be transferred to refill team.   1. Which medications need to be refilled? (please list name of each medication and dose if known) apixaban  (ELIQUIS ) 5 MG TABS tablet    2. Would you like to learn more about the convenience, safety, & potential cost savings by using the [facility]? No   3. Are you open to using the [facility] (Type [facility].) No   4. Which pharmacy/location (including street and city if local pharmacy) is medication to be sent to?  [id number] - [place], [place] - [address]   5. Do they need a 30 day or 90 day supply? 90 day

## 2023-12-02 ENCOUNTER — Other Ambulatory Visit: Payer: Self-pay | Admitting: Cardiology

## 2023-12-04 NOTE — Telephone Encounter (Signed)
 Prescription refill request for Eliquis  received. Indication:cva Last office visit:8/25 Scr:0.95  8/25 Age: 53 Weight:111.4  kg  Prescription refilled

## 2024-01-18 ENCOUNTER — Telehealth: Payer: Self-pay | Admitting: Adult Health

## 2024-01-18 NOTE — Telephone Encounter (Signed)
 Pt called requesting to r/s, his call was connected to billing dept re: No show appointment
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
## 2024-01-25 ENCOUNTER — Encounter: Payer: Self-pay | Admitting: Adult Health

## 2024-01-25 ENCOUNTER — Telehealth: Payer: Self-pay | Admitting: Adult Health

## 2024-01-25 ENCOUNTER — Ambulatory Visit: Admitting: Adult Health

## 2024-01-25 VITALS — BP 142/93 | HR 83 | Ht 69.0 in | Wt 249.8 lb

## 2024-01-25 DIAGNOSIS — M792 Neuralgia and neuritis, unspecified: Secondary | ICD-10-CM | POA: Diagnosis not present

## 2024-01-25 DIAGNOSIS — H547 Unspecified visual loss: Secondary | ICD-10-CM

## 2024-01-25 DIAGNOSIS — G8929 Other chronic pain: Secondary | ICD-10-CM | POA: Diagnosis not present

## 2024-01-25 DIAGNOSIS — I63531 Cerebral infarction due to unspecified occlusion or stenosis of right posterior cerebral artery: Secondary | ICD-10-CM | POA: Diagnosis not present

## 2024-01-25 DIAGNOSIS — M5416 Radiculopathy, lumbar region: Secondary | ICD-10-CM

## 2024-01-25 DIAGNOSIS — H53462 Homonymous bilateral field defects, left side: Secondary | ICD-10-CM

## 2024-01-25 MED ORDER — BACLOFEN 10 MG PO TABS: 10.0000 mg | ORAL_TABLET | Freq: Three times a day (TID) | ORAL | 11 refills | Status: AC | PRN

## 2024-01-25 MED ORDER — PREGABALIN 50 MG PO CAPS: 50.0000 mg | ORAL_CAPSULE | Freq: Two times a day (BID) | ORAL | 5 refills | Status: DC

## 2024-01-25 NOTE — Telephone Encounter (Signed)
 Referral to Ophthalmology faxed to [facility]  Phone:[phone] Fax:[phone]

## 2024-01-25 NOTE — Patient Instructions (Signed)
 Discontinue gabapentin  and start Lyrica 50mg  twice daily - please call after 1-2 weeks if pain persists to discuss dosage adjustment  Start baclofen 10 mg 3 times daily as needed - start with bedtime dose initially as this medication can cause fatigue, gradually add daytime dosages as needed  You will be called to complete an MRI of your lower back for further evaluation  Referrals will be placed to Dr. Octavia (general ophtho) and Dr. Jacques (neuro-ophtho) - you will be called to schedule an appointment   Continue Eliquis   and atorvastatin  for secondary stroke prevention  Continue to follow up with PCP regarding blood pressure and cholesterol management  Maintain strict control of hypertension with blood pressure goal below 130/90 and cholesterol with LDL cholesterol (bad cholesterol) goal below 70 mg/dL.   Signs of a Stroke? Follow the BEFAST method:  Balance Watch for a sudden loss of balance, trouble with coordination or vertigo Eyes Is there a sudden loss of vision in one or both eyes? Or double vision?  Face: Ask the person to smile. Does one side of the face droop or is it numb?  Arms: Ask the person to raise both arms. Does one arm drift downward? Is there weakness or numbness of a leg? Speech: Ask the person to repeat a simple phrase. Does the speech sound slurred/strange? Is the person confused ? Time: If you observe any of these signs, call 911.    Followup in the future with me in 3-4 months or call earlier if needed       Thank you for coming to see us  at [facility]. I hope we have been able to provide you high quality care today.  You may receive a patient satisfaction survey over the next few weeks. We would appreciate your feedback and comments so that we may continue to improve ourselves and the health of our patients.

## 2024-02-01 ENCOUNTER — Telehealth: Payer: Self-pay | Admitting: Adult Health

## 2024-02-01 NOTE — Telephone Encounter (Signed)
 healthy blue shara: [id number] exp. 02/01/24-04/30/24 sent to [facility] [phone]

## 2024-02-06 ENCOUNTER — Telehealth: Payer: Self-pay | Admitting: *Deleted

## 2024-02-06 NOTE — Telephone Encounter (Signed)
 Completed. To be reviewed signed.

## 2024-02-06 NOTE — Telephone Encounter (Signed)
 Medical exam form requested by [employer]

## 2024-02-06 NOTE — Telephone Encounter (Signed)
 This order was placed at his last visit. What needs to be reviewed?

## 2024-02-07 ENCOUNTER — Telehealth: Payer: Self-pay | Admitting: *Deleted

## 2024-02-07 NOTE — Telephone Encounter (Signed)
 Pt DSS form faxed on 02/07/2024

## 2024-02-07 NOTE — Telephone Encounter (Signed)
 Form signed. OV to be attached.  To Medical Records.

## 2024-02-17 ENCOUNTER — Ambulatory Visit
Admission: RE | Admit: 2024-02-17 | Discharge: 2024-02-17 | Disposition: A | Discharge status: Home / Self Care | Source: Ambulatory Visit | Attending: Adult Health | Admitting: Adult Health

## 2024-02-17 DIAGNOSIS — M5416 Radiculopathy, lumbar region: Secondary | ICD-10-CM

## 2024-02-17 DIAGNOSIS — G8929 Other chronic pain: Secondary | ICD-10-CM

## 2024-02-17 DIAGNOSIS — M5441 Lumbago with sciatica, right side: Secondary | ICD-10-CM | POA: Diagnosis not present

## 2024-02-17 DIAGNOSIS — M792 Neuralgia and neuritis, unspecified: Secondary | ICD-10-CM

## 2024-02-19 ENCOUNTER — Ambulatory Visit: Payer: Self-pay | Admitting: Adult Health

## 2024-02-19 DIAGNOSIS — G8929 Other chronic pain: Secondary | ICD-10-CM

## 2024-02-19 DIAGNOSIS — M5416 Radiculopathy, lumbar region: Secondary | ICD-10-CM
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
## 2024-04-10 ENCOUNTER — Ambulatory Visit (INDEPENDENT_AMBULATORY_CARE_PROVIDER_SITE_OTHER): Admitting: Physician Assistant

## 2024-04-10 ENCOUNTER — Encounter: Payer: Self-pay | Admitting: Physician Assistant

## 2024-04-10 ENCOUNTER — Ambulatory Visit

## 2024-04-10 VITALS — BP 146/102 | Ht 69.0 in | Wt 254.0 lb

## 2024-04-10 DIAGNOSIS — M545 Low back pain, unspecified: Secondary | ICD-10-CM | POA: Diagnosis not present

## 2024-04-10 DIAGNOSIS — M5126 Other intervertebral disc displacement, lumbar region: Secondary | ICD-10-CM

## 2024-04-10 DIAGNOSIS — M5416 Radiculopathy, lumbar region: Secondary | ICD-10-CM

## 2024-04-10 MED ORDER — PREGABALIN 75 MG PO CAPS: 75.0000 mg | ORAL_CAPSULE | Freq: Two times a day (BID) | ORAL | 1 refills | Status: AC

## 2024-05-13 ENCOUNTER — Ambulatory Visit: Admitting: Adult Health
